# Patient Record
Sex: Female | Born: 1965
Health system: Southern US, Community
[De-identification: ages and names within clinical notes are randomized; demographics above are authoritative.]

## PROBLEM LIST (undated history)

## (undated) DIAGNOSIS — J189 Pneumonia, unspecified organism: Secondary | ICD-10-CM

## (undated) DIAGNOSIS — R760 Raised antibody titer: Secondary | ICD-10-CM

## (undated) DIAGNOSIS — J45909 Unspecified asthma, uncomplicated: Secondary | ICD-10-CM

## (undated) HISTORY — DX: Pneumonia, unspecified organism: J18.9

## (undated) HISTORY — DX: Raised antibody titer: R76.0

## (undated) HISTORY — DX: Unspecified asthma, uncomplicated: J45.909

## (undated) HISTORY — PX: SHOULDER SURGERY: SHX246

---

## 1998-01-26 ENCOUNTER — Ambulatory Visit (HOSPITAL_COMMUNITY): Admission: RE | Admit: 1998-01-26 | Discharge: 1998-01-26 | Payer: Self-pay

## 1999-03-07 ENCOUNTER — Other Ambulatory Visit: Admission: RE | Admit: 1999-03-07 | Discharge: 1999-03-07 | Payer: Self-pay | Admitting: Obstetrics and Gynecology

## 2000-03-07 ENCOUNTER — Other Ambulatory Visit: Admission: RE | Admit: 2000-03-07 | Discharge: 2000-03-07 | Payer: Self-pay | Admitting: *Deleted

## 2001-04-09 ENCOUNTER — Other Ambulatory Visit: Admission: RE | Admit: 2001-04-09 | Discharge: 2001-04-09 | Payer: Self-pay | Admitting: Obstetrics and Gynecology

## 2002-04-13 ENCOUNTER — Other Ambulatory Visit: Admission: RE | Admit: 2002-04-13 | Discharge: 2002-04-13 | Payer: Self-pay | Admitting: Obstetrics and Gynecology

## 2003-04-18 ENCOUNTER — Other Ambulatory Visit: Admission: RE | Admit: 2003-04-18 | Discharge: 2003-04-18 | Payer: Self-pay | Admitting: Obstetrics and Gynecology

## 2008-02-04 ENCOUNTER — Ambulatory Visit: Payer: Self-pay | Admitting: Cardiology

## 2008-02-17 ENCOUNTER — Ambulatory Visit: Payer: Self-pay

## 2008-02-17 ENCOUNTER — Encounter: Payer: Self-pay | Admitting: Cardiology

## 2008-02-23 DIAGNOSIS — Z8679 Personal history of other diseases of the circulatory system: Secondary | ICD-10-CM | POA: Insufficient documentation

## 2008-02-24 ENCOUNTER — Ambulatory Visit: Payer: Self-pay | Admitting: Critical Care Medicine

## 2008-02-24 DIAGNOSIS — J309 Allergic rhinitis, unspecified: Secondary | ICD-10-CM | POA: Insufficient documentation

## 2008-02-24 DIAGNOSIS — R51 Headache: Secondary | ICD-10-CM

## 2008-02-24 DIAGNOSIS — R519 Headache, unspecified: Secondary | ICD-10-CM | POA: Insufficient documentation

## 2008-02-24 DIAGNOSIS — D689 Coagulation defect, unspecified: Secondary | ICD-10-CM

## 2008-02-29 ENCOUNTER — Telehealth (INDEPENDENT_AMBULATORY_CARE_PROVIDER_SITE_OTHER): Payer: Self-pay | Admitting: *Deleted

## 2008-04-18 ENCOUNTER — Ambulatory Visit: Payer: Self-pay | Admitting: Critical Care Medicine

## 2008-04-18 DIAGNOSIS — J45909 Unspecified asthma, uncomplicated: Secondary | ICD-10-CM | POA: Insufficient documentation

## 2008-07-04 ENCOUNTER — Telehealth (INDEPENDENT_AMBULATORY_CARE_PROVIDER_SITE_OTHER): Payer: Self-pay | Admitting: *Deleted

## 2011-01-08 NOTE — Assessment & Plan Note (Signed)
[facility]                            CARDIOLOGY OFFICE NOTE   DELTA, PICHON                          MRN:          [id number]  DATE:02/04/2008                            DOB:          20-Mar-1966    I was asked by Dr. Vickey Sages to  consult on Alexis Yu with  possible palpitations.   Alexis Yu is a delightful 45 year old married white female who has had  intermittent problems with what she thinks is palpitations.  She says  she will be sitting still and feel a heavy sensation in her chest that  will last for a second or two.  This is not associated with exertion.  She has had no presyncope or syncope.  She had no sustained tachy  palpitations.   She was gymnast growing up and never had any problems with exercise  tolerance.  She has never been told she has a heart murmur or any heart  condition.  She had a lot of these at the time she was pregnant.  She  felt like the physician blew her off.   She likes to run and exercise, though lately she has been having  exercise-induced asthma.  She has not seen a pulmonary specialist for  this.  She does not have a primary care physician.   PAST MEDICAL HISTORY:  She is not allergic to any medications.   Her current medications are:  1. Aspirin 81 mg a day.  2. A birth control pill.   She does not smoke.  She smoked up until 1990.   She has 1 to 2 alcoholic beverages a week.  She drinks quite a bit of  caffeinated beverages a day, up to 20 ounces.   Her surgical history:  She had a c-section in 1993.   FAMILY HISTORY:  Is negative for premature coronary disease, though  there is a history of coronary disease in her family.   SOCIAL HISTORY:  She is preschool Ecologist.   She is married and has 2 children.   REVIEW OF SYSTEMS:  She has a history of hypoglycemia.  He has a history  of some chronic fatigue.  Otherwise negative.   She says her lipids are always  good.  She is scheduled for blood work  Dr. Renaldo Fiddler in a few weeks.   Her exam today:  Her blood pressure is 115/77, heart rate is 80.  EKG is  completely normal.  She is 5 feet 9, weighs 182 pounds.  HEENT:  Normocephalic, atraumatic.  PERRL.  Extraocular movements  intact.  Sclerae are clear.  Facial symmetry normal.  Carotid upstrokes  are equal bilaterally without bruits, no JVD.  Thyroid is not enlarged.  Trachea is midline.  NECK:  Is supple.  LUNGS:  Clear to auscultation and percussion.  There is no rhonchi or  wheezes.  HEART:  Reveals a regular rate and rhythm.  There is a split S1 versus  an S4.  I can hear no definite click.  There is a soft little systolic  murmur at the  apex.  S2 splits physiologically. ABDOMINAL EXAM:  Soft,  good bowel sounds.  No midline bruit.  No hepatomegaly.  EXTREMITIES:  No cyanosis, clubbing or edema.  Pulses are intact.  NEUROLOGIC EXAM:  Is intact.   ASSESSMENT:  1. History of palpitations which are cyclical.  I suspect these are      benign.  She may have underlying prolapse.  2. Exercise-induced asthma.  As I discussed with her today, this is a      much more serious issue.   RECOMMENDATIONS:  1. A 2-D echocardiogram to evaluate for structural heart disease and      rule out prolapse.  2. Reassurance.  3. Pulmonary referral to Dr. Danise Mina for exercise-induced asthma.     Thomas C. Daleen Squibb, MD, [facility]  Electronically Signed    TCW/MedQ  DD: 02/04/2008  DT: 02/04/2008  Job #: [id number]   cc:   Charlcie Cradle. Delford Field, MD, FCCP

## 2013-01-06 ENCOUNTER — Other Ambulatory Visit: Payer: Self-pay | Admitting: Obstetrics & Gynecology

## 2013-01-06 ENCOUNTER — Ambulatory Visit (INDEPENDENT_AMBULATORY_CARE_PROVIDER_SITE_OTHER): Payer: BC Managed Care – PPO | Admitting: Obstetrics & Gynecology

## 2013-01-06 ENCOUNTER — Encounter: Payer: Self-pay | Admitting: Obstetrics & Gynecology

## 2013-01-06 VITALS — BP 117/74 | HR 67 | Resp 16 | Ht 70.0 in | Wt 192.0 lb

## 2013-01-06 DIAGNOSIS — K5900 Constipation, unspecified: Secondary | ICD-10-CM

## 2013-01-06 DIAGNOSIS — N920 Excessive and frequent menstruation with regular cycle: Secondary | ICD-10-CM

## 2013-01-06 DIAGNOSIS — R102 Pelvic and perineal pain: Secondary | ICD-10-CM

## 2013-01-06 DIAGNOSIS — Z01419 Encounter for gynecological examination (general) (routine) without abnormal findings: Secondary | ICD-10-CM

## 2013-01-06 DIAGNOSIS — K5909 Other constipation: Secondary | ICD-10-CM | POA: Insufficient documentation

## 2013-01-06 DIAGNOSIS — Z124 Encounter for screening for malignant neoplasm of cervix: Secondary | ICD-10-CM

## 2013-01-06 DIAGNOSIS — N949 Unspecified condition associated with female genital organs and menstrual cycle: Secondary | ICD-10-CM

## 2013-01-06 DIAGNOSIS — Z1151 Encounter for screening for human papillomavirus (HPV): Secondary | ICD-10-CM

## 2013-01-06 LAB — LIPID PANEL: LDL Cholesterol: 132 mg/dL — ABNORMAL HIGH (ref 0–99)

## 2013-01-06 LAB — TSH: TSH: 2.465 u[IU]/mL (ref 0.350–4.500)

## 2013-01-06 LAB — CBC
HCT: 38.4 % (ref 36.0–46.0)
Hemoglobin: 12.9 g/dL (ref 12.0–15.0)
MCHC: 33.6 g/dL (ref 30.0–36.0)
MCV: 88.1 fL (ref 78.0–100.0)

## 2013-01-06 NOTE — Progress Notes (Signed)
  Subjective:     Alexis Yu is a 47 y.o. female here for a routine exam.  Current complaints: history of thickened endometrium with heavy menses.  Was on OCPs but self d/c'd due to strong family history of stroke and anticardiolipin positive.  Pt has intermittent pelvic sharp pains that occur throughout cycle.  Pt has BM q3-4 days.  Personal health questionnaire reviewed: yes.   Gynecologic History Patient's last menstrual period was 12/30/2012. Contraception: vasectomy Last Pap: ?2010. Results were: normal per patient Last mammogram: a/. Results were: normal per pt  Obstetric History OB History   Grav Para Term Preterm Abortions TAB SAB Ect Mult Living   3 2 1 1 1  1   2      # Outc Date GA Lbr Len/2nd Wgt Sex Del Anes PTL Lv   1 TRM      SVD      Comments: induction   2 PRE  [redacted]w[redacted]d    CS      3 SAB                The following portions of the patient's history were reviewed and updated as appropriate: allergies, current medications, past family history, past medical history, past social history, past surgical history and problem list.  Review of Systems Pertinent items are noted in HPI.    Objective:   Filed Vitals:   01/06/13 0841  BP: 117/74  Pulse: 67  Resp: 16  Height: 5\' 10"  (1.778 m)  Weight: 192 lb (87.091 kg)      Vitals:  WNL General appearance: alert, cooperative and no distress Head: Normocephalic, without obvious abnormality, atraumatic Eyes: negative Throat: lips, mucosa, and tongue normal; teeth and gums normal Lungs: clear to auscultation bilaterally Breasts: normal appearance, no masses or tenderness, No nipple retraction or dimpling, No nipple discharge or bleeding Heart: regular rate and rhythm Abdomen: soft, non-tender; bowel sounds normal; no masses,  no organomegaly Pelvic: cervix normal in appearance, external genitalia normal, no adnexal masses or tenderness, no bladder tenderness, no cervical motion tenderness, perianal skin: no external  genital warts noted, rectovaginal septum normal, urethra without abnormality or discharge, uterus normal size (retroverted), shape, and consistency and vagina normal without discharge Extremities: no edema, redness or tenderness in the calves or thighs Skin: no lesions or rash Lymph nodes: Axillary adenopathy: none        Assessment:    Healthy female exam.  Pelvic pain mild history of thickened endometrium   Plan:    Education reviewed: self breast exams and skin cancer screening. Contraception: vasectomy. Mammogram ordered. Follow up in: 1 year. pap with HPV Miralax for contipation.  If having regular BM  and still having pain, pt to return CBC, TSH and TVUS only for heavy menses. Referral to Dr. Darra Lis for primary care

## 2013-01-07 ENCOUNTER — Telehealth: Payer: Self-pay | Admitting: *Deleted

## 2013-01-07 NOTE — Telephone Encounter (Signed)
Copy of labs mailed to pt's home address.  She will f/u on her cholesterol values with her PCP.

## 2013-01-12 ENCOUNTER — Ambulatory Visit (INDEPENDENT_AMBULATORY_CARE_PROVIDER_SITE_OTHER): Payer: BC Managed Care – PPO

## 2013-01-12 ENCOUNTER — Ambulatory Visit: Payer: BC Managed Care – PPO

## 2013-01-12 ENCOUNTER — Other Ambulatory Visit: Payer: Self-pay | Admitting: Obstetrics & Gynecology

## 2013-01-12 ENCOUNTER — Ambulatory Visit (HOSPITAL_BASED_OUTPATIENT_CLINIC_OR_DEPARTMENT_OTHER)
Admission: RE | Admit: 2013-01-12 | Discharge: 2013-01-12 | Disposition: A | Discharge status: Home / Self Care | Payer: BC Managed Care – PPO | Source: Ambulatory Visit | Attending: Obstetrics & Gynecology | Admitting: Obstetrics & Gynecology

## 2013-01-12 DIAGNOSIS — R102 Pelvic and perineal pain: Secondary | ICD-10-CM

## 2013-01-12 DIAGNOSIS — N949 Unspecified condition associated with female genital organs and menstrual cycle: Secondary | ICD-10-CM

## 2013-01-12 DIAGNOSIS — G8929 Other chronic pain: Secondary | ICD-10-CM

## 2013-01-12 DIAGNOSIS — Z01419 Encounter for gynecological examination (general) (routine) without abnormal findings: Secondary | ICD-10-CM

## 2013-01-12 DIAGNOSIS — N920 Excessive and frequent menstruation with regular cycle: Secondary | ICD-10-CM

## 2013-01-12 DIAGNOSIS — Z1231 Encounter for screening mammogram for malignant neoplasm of breast: Secondary | ICD-10-CM

## 2013-01-12 DIAGNOSIS — K5909 Other constipation: Secondary | ICD-10-CM

## 2013-01-19 ENCOUNTER — Other Ambulatory Visit: Payer: BC Managed Care – PPO

## 2013-01-19 ENCOUNTER — Ambulatory Visit: Payer: BC Managed Care – PPO

## 2013-01-25 ENCOUNTER — Encounter: Payer: Self-pay | Admitting: Obstetrics & Gynecology

## 2013-01-26 ENCOUNTER — Telehealth: Payer: Self-pay | Admitting: *Deleted

## 2013-01-26 NOTE — Telephone Encounter (Signed)
Message copied by Granville Lewis on Tue Jan 26, 2013  8:52 AM ------      Message from: Lesly Dukes      Created: Mon Jan 25, 2013  7:02 PM       Pt has nml [place].  Please call pt with results ------

## 2013-01-26 NOTE — Telephone Encounter (Signed)
Pt notified of normal u/s .  She is to F/u with Dr Linford Arnold for her cholesterol

## 2014-06-27 ENCOUNTER — Encounter: Payer: Self-pay | Admitting: Obstetrics & Gynecology

## 2015-09-27 ENCOUNTER — Ambulatory Visit (INDEPENDENT_AMBULATORY_CARE_PROVIDER_SITE_OTHER): Payer: 59 | Admitting: Osteopathic Medicine

## 2015-09-27 ENCOUNTER — Encounter: Payer: Self-pay | Admitting: Osteopathic Medicine

## 2015-09-27 VITALS — BP 120/62 | HR 69 | Ht 69.0 in | Wt 223.0 lb

## 2015-09-27 DIAGNOSIS — Z0000 Encounter for general adult medical examination without abnormal findings: Secondary | ICD-10-CM

## 2015-09-27 DIAGNOSIS — R61 Generalized hyperhidrosis: Secondary | ICD-10-CM

## 2015-09-27 DIAGNOSIS — Z23 Encounter for immunization: Secondary | ICD-10-CM | POA: Diagnosis not present

## 2015-09-27 DIAGNOSIS — Z13220 Encounter for screening for lipoid disorders: Secondary | ICD-10-CM

## 2015-09-27 DIAGNOSIS — Z2821 Immunization not carried out because of patient refusal: Secondary | ICD-10-CM

## 2015-09-27 DIAGNOSIS — M25531 Pain in right wrist: Secondary | ICD-10-CM | POA: Diagnosis not present

## 2015-09-27 DIAGNOSIS — N951 Menopausal and female climacteric states: Secondary | ICD-10-CM | POA: Diagnosis not present

## 2015-09-27 DIAGNOSIS — Z1239 Encounter for other screening for malignant neoplasm of breast: Secondary | ICD-10-CM

## 2015-09-27 DIAGNOSIS — Z79899 Other long term (current) drug therapy: Secondary | ICD-10-CM | POA: Diagnosis not present

## 2015-09-27 DIAGNOSIS — Z1211 Encounter for screening for malignant neoplasm of colon: Secondary | ICD-10-CM

## 2015-09-27 MED ORDER — ZOSTER VACCINE LIVE 19400 UNT/0.65ML ~~LOC~~ SOLR: 0.6500 mL | Freq: Once | SUBCUTANEOUS | Status: DC
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
## 2015-09-27 NOTE — Patient Instructions (Signed)
You should get a call about scheduling your mammogram, please let us know if you don't. You should receive your cologuard tests in the mail, please let us know if you don't get this in the next few weeks.  Please get lab work done fasting to ensure accurate fasting blood sugar for diabetes screening and accurate cholesterol reading. Using given a prescription for shingles vaccine to take to your pharmacy to check for insurance coverage.   Regarding nonhormonal treatment for menopausal symptoms, can try increasing soy or phyto-estrogens intake, black cohosh over-the-counter supplementation, vitamin D over-the-counter supplementation. If your symptoms are still bothering you, please return to the office and we can discuss other medication management options such as certain antidepressants or other nonhormonal treatments.   Regarding wrist pain, see below for instructions on home care for tendinitis. If this issue becomes worse or isn't resolving, we can schedule a follow-up visit with me, or you can see Dr. Denyse Amass or Dr. Benjamin Stain, our sports medicine physicians in the practice   Thanks for coming in today, please let us know if there is anything else we can do for you, please call us if there are any questions or concerns! Thanks! -Dr. Mervyn Skeeters.       Tendinitis Tendinitis is swelling and inflammation of the tendons. Tendons are band-like tissues that connect muscle to bone. Tendinitis commonly occurs in the:   Shoulders (rotator cuff).  Heels (Achilles tendon).  Elbows (triceps tendon). CAUSES Tendinitis is usually caused by overusing the tendon, muscles, and joints involved. When the tissue surrounding a tendon (synovium) becomes inflamed, it is called tenosynovitis. Tendinitis commonly develops in people whose jobs require repetitive motions. SYMPTOMS  Pain.  Tenderness.  Mild swelling. DIAGNOSIS Tendinitis is usually diagnosed by physical exam. Your health care provider may also  order X-rays or other imaging tests. TREATMENT Your health care provider may recommend certain medicines or exercises for your treatment. HOME CARE INSTRUCTIONS   Use a sling or splint for as long as directed by your health care provider until the pain decreases.  Put ice on the injured area.  Put ice in a plastic bag.  Place a towel between your skin and the bag.  Leave the ice on for 15-20 minutes, 3-4 times a day, or as directed by your health care provider.  Avoid using the limb while the tendon is painful. Perform gentle range of motion exercises only as directed by your health care provider. Stop exercises if pain or discomfort increase, unless directed otherwise by your health care provider.  Only take over-the-counter or prescription medicines for pain, discomfort, or fever as directed by your health care provider. SEEK MEDICAL CARE IF:   Your pain and swelling increase.  You develop new, unexplained symptoms, especially increased numbness in the hands. MAKE SURE YOU:   Understand these instructions.  Will watch your condition.  Will get help right away if you are not doing well or get worse.   This information is not intended to replace advice given to you by your health care provider. Make sure you discuss any questions you have with your health care provider.   Document Released: 08/09/2000 Document Revised: 09/02/2014 Document Reviewed: 10/29/2010 Elsevier Interactive Patient Education [employer]

## 2015-10-13 LAB — CBC WITH DIFFERENTIAL/PLATELET
BASOS ABS: 0 10*3/uL (ref 0.0–0.1)
Basophils Relative: 1 % (ref 0–1)
Eosinophils Absolute: 0.2 10*3/uL (ref 0.0–0.7)
Eosinophils Relative: 5 % (ref 0–5)
HEMATOCRIT: 40.9 % (ref 36.0–46.0)
HEMOGLOBIN: 13.4 g/dL (ref 12.0–15.0)
LYMPHS ABS: 1.5 10*3/uL (ref 0.7–4.0)
LYMPHS PCT: 37 % (ref 12–46)
MCH: 29.1 pg (ref 26.0–34.0)
MCHC: 32.8 g/dL (ref 30.0–36.0)
MCV: 88.7 fL (ref 78.0–100.0)
MPV: 9.6 fL (ref 8.6–12.4)
Monocytes Absolute: 0.4 10*3/uL (ref 0.1–1.0)
Monocytes Relative: 9 % (ref 3–12)
NEUTROS ABS: 2 10*3/uL (ref 1.7–7.7)
NEUTROS PCT: 48 % (ref 43–77)
Platelets: 337 10*3/uL (ref 150–400)
RBC: 4.61 MIL/uL (ref 3.87–5.11)
RDW: 13.6 % (ref 11.5–15.5)
WBC: 4.1 10*3/uL (ref 4.0–10.5)

## 2015-10-14 LAB — COMPLETE METABOLIC PANEL WITH GFR
ALBUMIN: 4.2 g/dL (ref 3.6–5.1)
ALK PHOS: 59 U/L (ref 33–130)
ALT: 12 U/L (ref 6–29)
AST: 15 U/L (ref 10–35)
BILIRUBIN TOTAL: 0.6 mg/dL (ref 0.2–1.2)
BUN: 14 mg/dL (ref 7–25)
CALCIUM: 9.3 mg/dL (ref 8.6–10.4)
CO2: 28 mmol/L (ref 20–31)
Chloride: 106 mmol/L (ref 98–110)
Creat: 0.95 mg/dL (ref 0.50–1.05)
GFR, EST AFRICAN AMERICAN: 81 mL/min (ref >=60)
GFR, EST NON AFRICAN AMERICAN: 70 mL/min (ref >=60)
GLUCOSE: 99 mg/dL (ref 65–99)
Potassium: 4.3 mmol/L (ref 3.5–5.3)
Sodium: 142 mmol/L (ref 135–146)
TOTAL PROTEIN: 6.9 g/dL (ref 6.1–8.1)

## 2015-10-14 LAB — LIPID PANEL
CHOLESTEROL: 187 mg/dL (ref 125–200)
HDL: 48 mg/dL (ref >=46)
LDL Cholesterol: 125 mg/dL (ref <130)
TRIGLYCERIDES: 68 mg/dL (ref <150)
Total CHOL/HDL Ratio: 3.9 Ratio (ref <=5.0)
VLDL: 14 mg/dL (ref <30)

## 2015-10-14 LAB — TSH: TSH: 2.92 m[IU]/L

## 2015-10-25 LAB — COLOGUARD: Cologuard: NEGATIVE

## 2015-11-10 ENCOUNTER — Telehealth: Payer: Self-pay | Admitting: Osteopathic Medicine

## 2015-11-10 NOTE — Telephone Encounter (Signed)
Please call patient: I have received her results from cologuard, testing was negative, plan to repeat in 3 years  

## 2015-11-10 NOTE — Telephone Encounter (Signed)
Left detailed message on patient vm with results as noted below. Treyvin Glidden,CMA  

## 2015-11-20 ENCOUNTER — Encounter: Payer: Self-pay | Admitting: Osteopathic Medicine

## 2015-12-12 ENCOUNTER — Encounter: Payer: Self-pay | Admitting: Osteopathic Medicine

## 2015-12-12 ENCOUNTER — Ambulatory Visit (INDEPENDENT_AMBULATORY_CARE_PROVIDER_SITE_OTHER): Payer: 59 | Admitting: Osteopathic Medicine

## 2015-12-12 VITALS — BP 120/83 | HR 76 | Ht 70.0 in | Wt 213.0 lb

## 2015-12-12 DIAGNOSIS — R21 Rash and other nonspecific skin eruption: Secondary | ICD-10-CM | POA: Diagnosis not present

## 2015-12-12 NOTE — Progress Notes (Signed)
HPI: Alexis Yu is a 50 y.o. female who [facility] for chief complaint of:  Chief Complaint  Patient presents with  . Rash    ON RIGHT SIDE OF CHEST THAT APPEARED A COUPLE OF DAYS AGO    SKIN . Location: upper R chest . Quality: dry, red, swollen . Duration: few days, "it just popped up"  Context: had another similar one on back which seemed to 'dry up" and now a dry patch remains there . Assoc signs/symptoms: no itching, no joint pain/fever, rash is nonpainful except occasionally irritated when clothing rubs against it   Past medical, social and family history reviewed: Past Medical History  Diagnosis Date  . Asthma   . Pneumonia   . Antiphospholipid antibody positive   . Uterine hyperplasia    Past Surgical History  Procedure Laterality Date  . Cesarean section  1993   Social History  Substance Use Topics  . Smoking status: Never Smoker   . Smokeless tobacco: Never Used  . Alcohol Use: Yes     Comment: occassional   Family History  Problem Relation Age of Onset  . Diabetes Father   . Stroke Father   . Heart disease Paternal Grandfather   . Heart disease Maternal Grandmother   . Stroke Maternal Grandmother   . Stroke Mother   . Depression Sister   . Cancer Paternal Uncle     ESOPHOGIAL    Current Outpatient Prescriptions  Medication Sig Dispense Refill  . Green Tea, Camillia sinensis, (GREEN TEA PO) Take 400 mg by mouth 2 (two) times daily.    Marland Kitchen. zoster vaccine live, PF, (ZOSTAVAX) [id number] UNT/0.65ML injection Inject 19,400 Units into the skin once. 1 each 0   No current facility-administered medications for this visit.   No Known Allergies    Review of Systems: CONSTITUTIONAL:  No  fever, no chills, No  unintentional weight changes HEAD/EYES/EARS/NOSE/THROAT: No  headache, no vision change, CARDIAC: No  chest pain, MUSCULOSKELETAL: No  myalgia/arthralgia SKIN: (+) rash/wounds/concerning  lesions HEM/ONC: No  easy bruising/bleeding, No  abnormal lymph node   Exam:  BP 120/83 mmHg  Pulse 76  Ht 5\' 10"  (1.778 m)  Wt 213 lb (96.616 kg)  BMI 30.56 kg/m2 Constitutional: VS see above. General Appearance: alert, well-developed, well-nourished, NAD Respiratory: Normal respiratory effort.  Musculoskeletal: Gait normal. No clubbing/cyanosis of digits.  Neurological: No cranial nerve deficit on limited exam. Motor and sensation intact and symmetric Skin: warm, dry, intact. (+)rash: approx 2cm diameter, irregular, (+)erythema with mild edema but no vesicles/ulceraion, no scaling, no purpura. No concerning nevi or subq nodules on limited exam.  Normal age-related skin changes/discoloration but no other concerning lesions/nevi. Lesion on back appears as light dry flat papule appears like extremely light-colored seborrheic dermatosis.  Psychiatric: Normal judgment/insight. Normal mood and affect. Oriented x3.    No results found for this or any previous visit (from the past 72 hour(s)).   Procedure: Punch biopsy skin. Patient was advised of risks of procedure, including pain, bleeding, infection, damage to surrounding tissues including nerve damage, inadequate diagnosis, scarring, possible need for further procedures - versus benefits, including accurate diagnosis to guide treatment. Patient opts to proceed with the biopsy. Skin was prepped with alcohol, anesthesia 1% lidocaine with epi, approx 1 mL, biopsy was taken on R chest, hemostasis obtained, 1 simple interrupted suture was placed, and wound is bandaged. Patient given home care instructions. Patient tolerated procedure well and all questions answered.  ASSESSMENT/PLAN:  Rash - appearance c/w mild fungal infection vs other inflammatory problem but has no itching or pain. Biopsy today, await results though appears benign at this time - Plan: Dermatology pathology   Return in about 8 days (around 12/20/2015), or sooner if needed,  for suture removal (12/22/15 at the latest).

## 2015-12-12 NOTE — Patient Instructions (Signed)
Laceration Care, Adult  A laceration is a cut that goes through all of the layers of the skin and into the tissue that is right under the skin. Some lacerations heal on their own. Others need to be closed with stitches (sutures), staples, skin adhesive strips, or skin glue. Proper laceration care minimizes the risk of infection and helps the laceration to heal better.  HOW TO CARE FOR YOUR LACERATION  If sutures or staples were used:  · Keep the wound clean and dry.  · If you were given a bandage (dressing), you should change it at least one time per day or as told by your health care provider. You should also change it if it becomes wet or dirty.  · Keep the wound completely dry for the first 24 hours or as told by your health care provider. After that time, you may shower or bathe. However, make sure that the wound is not soaked in water until after the sutures or staples have been removed.  · Clean the wound one time each day or as told by your health care provider:    Wash the wound with soap and water.    Rinse the wound with water to remove all soap.    Pat the wound dry with a clean towel. Do not rub the wound.  · After cleaning the wound, apply a thin layer of antibiotic ointment as told by your health care provider. This will help to prevent infection and keep the dressing from sticking to the wound.  · Have the sutures or staples removed as told by your health care provider.    General Instructions  · Take over-the-counter and prescription medicines only as told by your health care provider.  · If you were prescribed an antibiotic medicine or ointment, take or apply it as told by your doctor. Do not stop using it even if your condition improves.  · To help prevent scarring, make sure to cover your wound with sunscreen whenever you are outside after stitches are removed, after adhesive strips are removed, or when glue remains in place and the wound is healed. Make sure to wear a sunscreen of at least 30  SPF.  · Do not scratch or pick at the wound.  · Keep all follow-up visits as told by your health care provider. This is important.  · Check your wound every day for signs of infection. Watch for:    Redness, swelling, or pain.    Fluid, blood, or pus.  · Raise (elevate) the injured area above the level of your heart while you are sitting or lying down, if possible.  SEEK MEDICAL CARE IF:  · You received a tetanus shot and you have swelling, severe pain, redness, or bleeding at the injection site.  · You have a fever.  · A wound that was closed breaks open.  · You notice a bad smell coming from your wound or your dressing.  · You notice something coming out of the wound, such as wood or glass.  · Your pain is not controlled with medicine.  · You have increased redness, swelling, or pain at the site of your wound.  · You have fluid, blood, or pus coming from your wound.  · You notice a change in the color of your skin near your wound.  · You need to change the dressing frequently due to fluid, blood, or pus draining from the wound.  · You develop a new rash.  ·   You develop numbness around the wound.  SEEK IMMEDIATE MEDICAL CARE IF:  · You develop severe swelling around the wound.  · Your pain suddenly increases and is severe.  · You develop painful lumps near the wound or on skin that is anywhere on your body.  · You have a red streak going away from your wound.  · The wound is on your hand or foot and you cannot properly move a finger or toe.  · The wound is on your hand or foot and you notice that your fingers or toes look pale or bluish.     This information is not intended to replace advice given to you by your health care provider. Make sure you discuss any questions you have with your health care provider.     Document Released: 08/12/2005 Document Revised: 12/27/2014 Document Reviewed: 08/08/2014  Elsevier Interactive Patient Education ©2016 [employer] Inc[employer]

## 2015-12-15 ENCOUNTER — Encounter: Payer: Self-pay | Admitting: Osteopathic Medicine

## 2015-12-20 ENCOUNTER — Encounter: Payer: Self-pay | Admitting: Osteopathic Medicine

## 2015-12-20 ENCOUNTER — Ambulatory Visit (INDEPENDENT_AMBULATORY_CARE_PROVIDER_SITE_OTHER): Payer: 59 | Admitting: Osteopathic Medicine

## 2015-12-20 VITALS — BP 103/70 | HR 96 | Ht 70.0 in | Wt 215.0 lb

## 2015-12-20 DIAGNOSIS — L280 Lichen simplex chronicus: Secondary | ICD-10-CM | POA: Diagnosis not present

## 2015-12-20 DIAGNOSIS — Z4802 Encounter for removal of sutures: Secondary | ICD-10-CM

## 2015-12-20 NOTE — Progress Notes (Signed)
HPI: Alexis Yu is a 50 y.o. female who [facility] [place] today for chief complaint of:  Chief Complaint  Patient presents with  . Suture / Staple Removal     . Location: R chest  . Quality: healing fine, skin looking a bit better . Context: recent punch biopsy . Assoc signs/symptoms: no redness or drainage, no fever   Past medical, social and family history reviewed: Past Medical History  Diagnosis Date  . Asthma   . Pneumonia   . Antiphospholipid antibody positive   . Uterine hyperplasia    Past Surgical History  Procedure Laterality Date  . Cesarean section  1993   Social History  Substance Use Topics  . Smoking status: Never Smoker   . Smokeless tobacco: Never Used  . Alcohol Use: Yes     Comment: occassional   Family History  Problem Relation Age of Onset  . Diabetes Father   . Stroke Father   . Heart disease Paternal Grandfather   . Heart disease Maternal Grandmother   . Stroke Maternal Grandmother   . Stroke Mother   . Depression Sister   . Cancer Paternal Uncle     ESOPHOGIAL    Current Outpatient Prescriptions  Medication Sig Dispense Refill  . Green Tea, Camillia sinensis, (GREEN TEA PO) Take 400 mg by mouth 2 (two) times daily.    Marland Kitchen. zoster vaccine live, PF, (ZOSTAVAX) [id number] UNT/0.65ML injection Inject 19,400 Units into the skin once. 1 each 0   No current facility-administered medications for this visit.   No Known Allergies    Review of Systems: CONSTITUTIONAL:  No  fever, no chills SKIN: No  Additional rash/wounds/concerning lesions HEM/ONC: No  easy bruising/bleeding, No  abnormal lymph node   Exam:  BP 103/70 mmHg  Pulse 96  Ht 5\' 10"  (1.778 m)  Wt 215 lb (97.523 kg)  BMI 30.85 kg/m2 Constitutional: VS see above. General Appearance: alert, well-developed, well-nourished, NAD Skin: warm, dry, intact. Sutures removed without difficulty. No rash/ulcer.  Psychiatric: Normal judgment/insight.  Normal mood and affect.  PROCEDURE: Skin cleaned with alcohol swab, single suture removed with scissors and forceps without difficulty.     ASSESSMENT/PLAN:   Lichenoid dermatitis - patient educated on biopsy results - benign diagnosis  Visit for suture removal   Return as needed and 09/2016 for annual physical.

## 2016-06-05 ENCOUNTER — Ambulatory Visit (INDEPENDENT_AMBULATORY_CARE_PROVIDER_SITE_OTHER): Payer: 59

## 2016-06-05 ENCOUNTER — Ambulatory Visit (INDEPENDENT_AMBULATORY_CARE_PROVIDER_SITE_OTHER): Payer: 59 | Admitting: Osteopathic Medicine

## 2016-06-05 ENCOUNTER — Encounter: Payer: Self-pay | Admitting: Osteopathic Medicine

## 2016-06-05 VITALS — BP 116/73 | HR 97 | Temp 97.9°F | Ht 69.0 in | Wt 223.0 lb

## 2016-06-05 DIAGNOSIS — R05 Cough: Secondary | ICD-10-CM

## 2016-06-05 DIAGNOSIS — R0602 Shortness of breath: Secondary | ICD-10-CM

## 2016-06-05 DIAGNOSIS — J4521 Mild intermittent asthma with (acute) exacerbation: Secondary | ICD-10-CM | POA: Insufficient documentation

## 2016-06-05 DIAGNOSIS — R059 Cough, unspecified: Secondary | ICD-10-CM

## 2016-06-05 DIAGNOSIS — Z8709 Personal history of other diseases of the respiratory system: Secondary | ICD-10-CM | POA: Diagnosis not present

## 2016-06-05 MED ORDER — E-Z SPACER DEVI: 2 refills | Status: AC

## 2016-06-05 MED ORDER — ALBUTEROL SULFATE HFA 108 (90 BASE) MCG/ACT IN AERS: 2.0000 | INHALATION_SPRAY | Freq: Four times a day (QID) | RESPIRATORY_TRACT | 6 refills | Status: DC | PRN

## 2016-06-05 MED ORDER — IPRATROPIUM-ALBUTEROL 0.5-2.5 (3) MG/3ML IN SOLN
3.0000 mL | Freq: Once | RESPIRATORY_TRACT | Status: AC
  Administered 2016-06-05: 3 mL via RESPIRATORY_TRACT

## 2016-06-05 MED ORDER — AZITHROMYCIN 250 MG PO TABS
ORAL_TABLET | ORAL | 0 refills | Status: DC
Start: 2016-06-05 — End: 2016-10-02

## 2016-06-05 MED ORDER — IPRATROPIUM-ALBUTEROL 0.5-2.5 (3) MG/3ML IN SOLN
3.0000 mL | Freq: Four times a day (QID) | RESPIRATORY_TRACT | Status: DC
  Administered 2016-06-05: 3 mL via RESPIRATORY_TRACT

## 2016-06-05 MED ORDER — PREDNISONE 20 MG PO TABS: 20.0000 mg | ORAL_TABLET | Freq: Two times a day (BID) | ORAL | 0 refills | Status: DC

## 2016-06-05 NOTE — Progress Notes (Signed)
HPI: Alexis Yu is a 50 y.o. female who [facility] [place]  today for chief complaint of:  Chief Complaint  Patient presents with  . Cough    Acute Illness: . Context: URI few weeks ago, cough has persisted, feeling SOB . Location/Quality: chest congestion and tightness feeling, persistent cough . Assoc signs/symptoms: see ROS . Duration: >2 weeks . Modifying factors: has tried the following OTC/Rx medications: OTC cold treatments initially but cough and SOB have got her concerned   Past medical, social and family history reviewed. PMH includes previous Dx asthma - previously on ICS and SABA  Current medications and allergies reviewed.     Review of Systems:  Constitutional: no fever/chills  HEENT: no headache, no vision change or hearing change, no sore throat  Cardiovascular: No chest pain, No pressure/palpitations  Respiratory: yes cough, yes shortness of breath  Gastrointestinal: no nausea, no vomiting, no abdominal pain, no diarrhea  Musculoskeletal: no myalgia/arthralgia  Skin/Integument: no rash   Exam:  BP 116/73   Pulse 97   Temp 97.9 F (36.6 C) (Oral)   Ht 5\' 9"  (1.753 m)   Wt 223 lb (101.2 kg)   BMI 32.93 kg/m   Constitutional: VSS, see above. General Appearance: alert, well-developed, well-nourished, NAD  Eyes: Normal lids and conjunctive, non-icteric sclera, PERRLA  Ears, Nose, Mouth, Throat: Normal external inspection ears/nares/mouth/lips/gums, normal TM, MMM; posterior pharynx without erythema, without exudate, nasal mucosa normal  Neck: No masses, trachea midline. normal lymph nodes  Respiratory: Normal respiratory effort. Yes  wheeze a bit worse on L, no rhonchi/rales. Resolved after breathing treatment.   Cardiovascular: S1/S2 normal, no murmur/rub/gallop auscultated. RRR.    Dg Chest 2 View  Result Date: 06/05/2016 CLINICAL DATA:  Cough and shortness of breath for 1 month EXAM: CHEST  2 VIEW  COMPARISON:  02/24/2008 FINDINGS: The heart size and mediastinal contours are within normal limits. Both lungs are clear. The visualized skeletal structures are unremarkable. IMPRESSION: No active cardiopulmonary disease. Electronically Signed   By: Alcide CleverMark  Lukens M.D.   On: 06/05/2016 14:40     ASSESSMENT/PLAN:  Mild intermittent asthma with acute exacerbation - Plan: albuterol (PROVENTIL HFA;VENTOLIN HFA) 108 (90 Base) MCG/ACT inhaler, predniSONE (DELTASONE) 20 MG tablet, azithromycin (ZITHROMAX) 250 MG tablet, ipratropium-albuterol (DUONEB) 0.5-2.5 (3) MG/3ML nebulizer solution 3 mL, DISCONTINUED: ipratropium-albuterol (DUONEB) 0.5-2.5 (3) MG/3ML nebulizer solution 3 mL  Cough - Plan: DG Chest 2 View  Shortness of breath - Plan: DG Chest 2 View  History of asthma - Plan: albuterol (PROVENTIL HFA;VENTOLIN HFA) 108 (90 Base) MCG/ACT inhaler  Patient Instructions  Inhaler use every 2-4 hours as needed for shortness of breath. Steroids for 5 days. If no improvement, I gave prescription for antibiotics to take just in case this gets worse, but I don't see any pneumonia on the chest x-ray today and neither did the radiologist's I doubt very much that we'll need this antibiotic. If you find that he needs to fill it, do call the office and let me know. If you're feeling better in the space of the week, probably no further workup will be needed. If you are not feeling better or if you're having some residual cough/illness of breath we will need to start an inhaled steroid medication. Please let us know if you have any other questions or concerns, otherwise plan to follow-up for routine annual physical!    Visit summary was printed for the patient with medications and pertinent instructions for patient to  review. ER/RTC precautions reviewed. All questions answered. Return if symptoms worsen or fail to improve, for e.

## 2016-06-05 NOTE — Patient Instructions (Signed)
Inhaler use every 2-4 hours as needed for shortness of breath. Steroids for 5 days. If no improvement, I gave prescription for antibiotics to take just in case this gets worse, but I don't see any pneumonia on the chest x-ray today and neither did the radiologist's I doubt very much that we'll need this antibiotic. If you find that he needs to fill it, do call the office and let me know. If you're feeling better in the space of the week, probably no further workup will be needed. If you are not feeling better or if you're having some residual cough/illness of breath we will need to start an inhaled steroid medication. Please let us know if you have any other questions or concerns, otherwise plan to follow-up for routine annual physical!

## 2016-10-02 ENCOUNTER — Encounter: Payer: Self-pay | Admitting: Family Medicine

## 2016-10-02 ENCOUNTER — Ambulatory Visit (INDEPENDENT_AMBULATORY_CARE_PROVIDER_SITE_OTHER): Payer: 59 | Admitting: Family Medicine

## 2016-10-02 DIAGNOSIS — M25511 Pain in right shoulder: Secondary | ICD-10-CM | POA: Diagnosis not present

## 2016-10-02 MED ORDER — OMEPRAZOLE 40 MG PO CPDR
40.0000 mg | DELAYED_RELEASE_CAPSULE | Freq: Every day | ORAL | 3 refills | Status: DC
Start: 2016-10-02 — End: 2018-07-27

## 2016-10-02 MED ORDER — DICLOFENAC SODIUM 1 % TD GEL: 4.0000 g | Freq: Four times a day (QID) | TRANSDERMAL | 11 refills | Status: DC

## 2016-10-02 NOTE — Patient Instructions (Signed)
Thank you for coming in today. Call or go to the ER if you develop a large red swollen joint with extreme pain or oozing puss.  Do PT Use voltaren gel and home oral ibuprofen or naproxen. If you are taking ibuprofen or naproxen for more than a few days please take prilosec of zantac or papcid with it to protect your stomach.    Shoulder Impingement Syndrome Shoulder impingement syndrome is a condition that causes pain when connective tissues (tendons) surrounding the shoulder joint become pinched. These tendons are part of the group of muscles and tissues that help to stabilize the shoulder (rotator cuff). Beneath the rotator cuff is a fluid-filled sac (bursa) that allows the muscles and tendons to glide smoothly. The bursa may become swollen or irritated (bursitis). Bursitis, swelling in the rotator cuff tendons, or both conditions can decrease how much space is under a bone in the shoulder joint (acromion), resulting in impingement. What are the causes? Shoulder impingement syndrome can be caused by bursitis or swelling of the rotator cuff tendons, which may result from:  Repetitive overhead arm movements.  Falling onto the shoulder.  Weakness in the shoulder muscles. What increases the risk? You may be more likely to develop this condition if you are an athlete who participates in:  Sports that involve throwing, such as baseball.  Tennis.  Swimming.  Volleyball. Some people are also more likely to develop impingement syndrome because of the shape of their acromion bone. What are the signs or symptoms? The main symptom of this condition is pain on the front or side of the shoulder. Pain may:  Get worse when lifting or raising the arm.  Get worse at night.  Wake you up from sleeping.  Feel sharp when the shoulder is moved, and then fade to an ache. Other signs and symptoms may include:  Tenderness.  Stiffness.  Inability to raise the arm above shoulder level or behind the  body.  Weakness. How is this diagnosed? This condition may be diagnosed based on:  Your symptoms.  Your medical history.  A physical exam.  Imaging tests, such as:  X-rays.  MRI.  Ultrasound. How is this treated? Treatment for this condition may include:  Resting your shoulder and avoiding all activities that cause pain or put stress on the shoulder.  Icing your shoulder.  NSAIDs to help reduce pain and swelling.  One or more injections of medicines to numb the area and reduce inflammation.  Physical therapy.  Surgery. This may be needed if nonsurgical treatments have not helped. Surgery may involve repairing the rotator cuff, reshaping the acromion, or removing the bursa. Follow these instructions at home: Managing pain, stiffness, and swelling  If directed, apply ice to the injured area.  Put ice in a plastic bag.  Place a towel between your skin and the bag.  Leave the ice on for 20 minutes, 2-3 times a day. Activity  Rest and return to your normal activities as told by your health care provider. Ask your health care provider what activities are safe for you.  Do exercises as told by your health care provider. General instructions  Do not use any tobacco products, including cigarettes, chewing tobacco, or e-cigarettes. Tobacco can delay healing. If you need help quitting, ask your health care provider.  Ask your health care provider when it is safe for you to drive.  Take over-the-counter and prescription medicines only as told by your health care provider.  Keep all follow-up visits as  told by your health care provider. This is important. How is this prevented?  Give your body time to rest between periods of activity.  Be safe and responsible while being active to avoid falls.  Maintain physical fitness, including strength and flexibility. Contact a health care provider if:  Your symptoms have not improved after 1-2 months of treatment and  rest.  You cannot lift your arm away from your body. This information is not intended to replace advice given to you by your health care provider. Make sure you discuss any questions you have with your health care provider. Document Released: 08/12/2005 Document Revised: 04/18/2016 Document Reviewed: 07/15/2015 Elsevier Interactive Patient Education  2017 Elsevier Inc.    Shoulder Impingement Syndrome Rehab Ask your health care provider which exercises are safe for you. Do exercises exactly as told by your health care provider and adjust them as directed. It is normal to feel mild stretching, pulling, tightness, or discomfort as you do these exercises, but you should stop right away if you feel sudden pain or your pain gets worse.Do not begin these exercises until told by your health care provider. Stretching and range of motion exercise This exercise warms up your muscles and joints and improves the movement and flexibility of your shoulder. This exercise also helps to relieve pain and stiffness. Exercise A: Passive horizontal adduction 1. Sit or stand and pull your left / right elbow across your chest, toward your other shoulder. Stop when you feel a gentle stretch in the back of your shoulder and upper arm.  Keep your arm at shoulder height.  Keep your arm as close to your body as you comfortably can. 2. Hold for __________ seconds. 3. Slowly return to the starting position. Repeat __________ times. Complete this exercise __________ times a day. Strengthening exercises These exercises build strength and endurance in your shoulder. Endurance is the ability to use your muscles for a long time, even after they get tired. Exercise B: External rotation, isometric 1. Stand or sit in a doorway, facing the door frame. 2. Bend your left / right elbow and place the back of your wrist against the door frame. Only your wrist should be touching the frame. Keep your upper arm at your  side. 3. Gently press your wrist against the door frame, as if you are trying to push your arm away from your abdomen.  Avoid shrugging your shoulder while you press your hand against the door frame. Keep your shoulder blade tucked down toward the middle of your back. 4. Hold for __________ seconds. 5. Slowly release the tension, and relax your muscles completely before you do the exercise again. Repeat __________ times. Complete this exercise __________ times a day. Exercise C: Internal rotation, isometric 1. Stand or sit in a doorway, facing the door frame. 2. Bend your left / right elbow and place the inside of your wrist against the door frame. Only your wrist should be touching the frame. Keep your upper arm at your side. 3. Gently press your wrist against the door frame, as if you are trying to push your arm toward your abdomen.  Avoid shrugging your shoulder while you press your hand against the door frame. Keep your shoulder blade tucked down toward the middle of your back. 4. Hold for __________ seconds. 5. Slowly release the tension, and relax your muscles completely before you do the exercise again. Repeat __________ times. Complete this exercise __________ times a day. Exercise D: Scapular protraction, supine 1. Lie on  your back on a firm surface. Hold a __________ weight in your left / right hand. 2. Raise your left / right arm straight into the air so your hand is directly above your shoulder joint. 3. Push the weight into the air so your shoulder lifts off of the surface that you are lying on. Do not move your head, neck, or back. 4. Hold for __________ seconds. 5. Slowly return to the starting position. Let your muscles relax completely before you repeat this exercise. Repeat __________ times. Complete this exercise __________ times a day. Exercise E: Scapular retraction 1. Sit in a stable chair without armrests, or stand. 2. Secure an exercise band to a stable object in  front of you so the band is at shoulder height. 3. Hold one end of the exercise band in each hand. Your palms should face down. 4. Squeeze your shoulder blades together and move your elbows slightly behind you. Do not shrug your shoulders while you do this. 5. Hold for __________ seconds. 6. Slowly return to the starting position. Repeat __________ times. Complete this exercise __________ times a day. Exercise F: Shoulder extension 1. Sit in a stable chair without armrests, or stand. 2. Secure an exercise band to a stable object in front of you where the band is above shoulder height. 3. Hold one end of the exercise band in each hand. 4. Straighten your elbows and lift your hands up to shoulder height. 5. Squeeze your shoulder blades together and pull your hands down to the sides of your thighs. Stop when your hands are straight down by your sides. Do not let your hands go behind your body. 6. Hold for __________ seconds. 7. Slowly return to the starting position. Repeat __________ times. Complete this exercise __________ times a day. This information is not intended to replace advice given to you by your health care provider. Make sure you discuss any questions you have with your health care provider. Document Released: 08/12/2005 Document Revised: 04/18/2016 Document Reviewed: 07/15/2015 Elsevier Interactive Patient Education  2017 [employer]

## 2016-10-02 NOTE — Progress Notes (Signed)
   Subjective:    I'm seeing this patient as a consultation for:  Sunnie NielsenNatalie Alexander, DO   CC: Right shoulder pain  HPI: Patient notes a two-month history of right shoulder pain. The pain is located in the upper lateral arm in the posterior aspect of the shoulder. Pain is resolved overhead motion reaching back and at night. She denies any radiating pain weakness or numbness. She can't recall any injury. She's been taking Tylenol and using higher stem as well as home exercise program dedicated for rotator cuff stabilization. She has a pertinent past history of left rotator cuff tear about 10 or 15 years ago. She notes pain is not similar to the previous rotator cuff tear.  Past medical history, Surgical history, Family history not pertinant except as noted below, Social history, Allergies, and medications have been entered into the medical record, reviewed, and no changes needed.   Review of Systems: No headache, visual changes, nausea, vomiting, diarrhea, constipation, dizziness, abdominal pain, skin rash, fevers, chills, night sweats, weight loss, swollen lymph nodes, body aches, joint swelling, muscle aches, chest pain, shortness of breath, mood changes, visual or auditory hallucinations.   Objective:    Vitals:   10/02/16 1455  BP: (!) 117/58  Pulse: 85   General: Well Developed, well nourished, and in no acute distress.  Neuro/Psych: Alert and oriented x3, extra-ocular muscles intact, able to move all 4 extremities, sensation grossly intact. Skin: Warm and dry, no rashes noted.  Respiratory: Not using accessory muscles, speaking in full sentences, trachea midline.  Cardiovascular: Pulses palpable, no extremity edema. Abdomen: Does not appear distended. MSK: Right shoulder is normal appearing Mildly tender to palpation overlying the acromioclavicular joint. Intact abduction external and internal rotation over painful abduction arc is present. Internal rotation limited to the lumbar  spine. Strength testing is intact over patient is slightly weak and painful with resisted external rotation. Mildly positive Hawkins and Neer's test. Negative empty can test. Negative Yergason's and speeds test.  Pulses capillary refill sensation intact distally.   No results found for this or any previous visit (from the past 24 hour(s)). No results found.  Impression and Recommendations:    Assessment and Plan: 51 y.o. female with Right shoulder pain very likely rotator cuff tendinopathy versus impingement. I think the most likely tendon involvement is the infraspinatus and possibly the posterior portion of the supraspinatus. I'm doubtful for full thickness rotator cuff tears patient has preserved strength. Patient would like to avoid injections today for possible. Plan for a trial of diclofenac gel as well as physical therapy and dedicated home physical therapy. Recheck in 4-6 weeks or so .   Discussed warning signs or symptoms. Please see discharge instructions. Patient expresses understanding.

## 2016-10-11 ENCOUNTER — Ambulatory Visit (INDEPENDENT_AMBULATORY_CARE_PROVIDER_SITE_OTHER): Payer: 59 | Admitting: Physical Therapy

## 2016-10-11 ENCOUNTER — Encounter: Payer: Self-pay | Admitting: Physical Therapy

## 2016-10-11 DIAGNOSIS — M25511 Pain in right shoulder: Secondary | ICD-10-CM | POA: Diagnosis not present

## 2016-10-11 DIAGNOSIS — M6281 Muscle weakness (generalized): Secondary | ICD-10-CM

## 2016-10-11 NOTE — Patient Instructions (Addendum)
Over Head Pull: Narrow Grip     [place] [phone], once a day.    On back, knees bent, feet flat, band across thighs, elbows straight but relaxed. Pull hands apart (start). Keeping elbows straight, bring arms up and over head, hands toward floor. Keep pull steady on band. Hold momentarily. Return slowly, keeping pull steady, back to start. Repeat _2-3 x10__ times. Band color __red____   Side Pull: Double Arm   On back, knees bent, feet flat. Arms perpendicular to body, shoulder level, elbows straight but relaxed. Pull arms out to sides, elbows straight. Resistance band comes across collarbones, hands toward floor. Squeeze shoulder blades together.  Hold momentarily. Slowly return to starting position. Repeat _2-3x10__ times. Band color __red___   Elisabeth CaraSash - thumb should be facing up when the hand is above your head.    On back, knees bent, feet flat, left hand on left hip, right hand above left. Pull right arm DIAGONALLY (hip to shoulder) across chest. Bring right arm along head toward floor. Hold momentarily. Slowly return to starting position. Repeat _2-3x10__ times. Do with left arm. Band color ___red___   Shoulder Rotation: Double Arm   On back, knees bent, feet flat, elbows tucked at sides, bent 90, hands palms up. Pull hands apart and down toward floor, keeping elbows near sides. Squeeze shoulder blades together.  Hold momentarily. Slowly return to starting position. Repeat _2-3 x10__ times. Band color __red____   Scapula Adduction With Pectorals, Mid-Range - draw shoulder blades down on back.    Stand in doorframe with palms against frame and arms at 90. Lean forward and squeeze shoulder blades. Hold __30_ seconds. Repeat _1__ times per session. Do _1_ sessions per day. Copyright  VHI. All rights reserved.   Supine isometric Rt shoulder ER 10x10sec as needed at night for pain.

## 2016-10-11 NOTE — Therapy (Signed)
[facility] [place] [address] [place] [address] [place], [place], [address] Phone: [phone]   Fax:  [phone]  Physical Therapy Evaluation  Patient Details  Name: Alexis Yu MRN: [id number] Date of Birth: 1965-09-17 Referring Provider: Dr Teressa Lower  Encounter Date: 10/11/2016      PT End of Session - 10/11/16 1337    Visit Number 1   Number of Visits 6   Date for PT Re-Evaluation 11/22/16   PT Start Time 1340   PT Stop Time 1439   PT Time Calculation (min) 59 min   Activity Tolerance Patient tolerated treatment well      Past Medical History:  Diagnosis Date  . Antiphospholipid antibody positive   . Asthma   . Pneumonia   . Uterine hyperplasia     Past Surgical History:  Procedure Laterality Date  . CESAREAN SECTION  1993    There were no vitals filed for this visit.       Subjective Assessment - 10/11/16 1343    Subjective Pt reports she developed Rt shoulder pain about 2 months ago of insidious onset, its getting progressively worse and now waking her at night.    Pertinent History Lt TRC tear 15 yrs ago ( nonsurgical treatment )    Diagnostic tests none   Patient Stated Goals return to working out fully ( cardio and machines at [employer]) restore her motion, decrease pain.    Currently in Pain? Yes   Pain Score 1    Pain Location Shoulder   Pain Orientation Right  anterior and posterior shoulder   Pain Descriptors / Indicators Dull  gets sharp and burning when pain wakes her.    Pain Type Acute pain   Pain Onset More than a month ago   Pain Frequency Constant   Aggravating Factors  sleeping, washing opposite shoulder, reaching for seatbelt   Pain Relieving Factors rest, medication             [facility] PT Assessment - 10/11/16 0001      Assessment   Medical Diagnosis Rt shoulder pain    Referring Provider Dr Teressa Lower   Onset Date/Surgical Date 08/10/16   Hand Dominance Right   Next MD Visit as needed   Prior Therapy no for Rt shoulder     Precautions   Precautions None     Balance Screen   Has the patient fallen in the past 6 months No     Prior Function   Level of Independence Independent   Vocation Full time employment   Multimedia programmer and preschool teacher   Leisure working out, sleep, gardening and scrapbooking     Observation/Other Assessments   Focus on Therapeutic Outcomes (FOTO)  39% limited     Posture/Postural Control   Posture/Postural Control Postural limitations   Postural Limitations Forward head  elevated Rt shoulder complex     ROM / Strength   AROM / PROM / Strength AROM;Strength     AROM   AROM Assessment Site Shoulder;Cervical  Rt shoulder reaching behind back to ~ T 10   Right/Left Shoulder --  bilat WNL however pain at endrange Rt all    Cervical Flexion WNl   Cervical Extension decreased 25%,slight tilt to the Lt    Cervical - Right Side Bend WNL   Cervical - Left Side Bend WNL   Cervical - Right Rotation WNL   Cervical - Left Rotation WNL     Strength  Strength Assessment Site Shoulder;Elbow  mid trap Rt 3+/5   Right/Left Shoulder --  WNL except ER Rt 3+/5 with pain, Lt 4/5   Right/Left Elbow --  bilat WNl     Palpation   Palpation comment tender posterior Rt shoulder and in bicepital groove                   [facility] Adult PT Treatment/Exercise - 10/11/16 0001      Exercises   Exercises Shoulder     Shoulder Exercises: Supine   Other Supine Exercises 2x10, red band, overhead pull, horizontal abd, SASH, ER  ER with yellow band     Shoulder Exercises: Stretch   Other Shoulder Stretches door way stretch mid x 30 sec                PT Education - 10/11/16 1337    Education provided Yes   Education Details HEP   Person(s) Educated Patient   Methods Explanation;Demonstration;Handout   Comprehension Returned demonstration;Verbalized understanding             PT Long Term Goals -  10/11/16 1336      PT LONG TERM GOAL #1   Title I with advanced HEP (11/22/16)    Time 6   Period Weeks   Status New     PT LONG TERM GOAL #2   Title report decreased Rt shoulder pain =/> 75% with holding her arm out to the side. ( 11/22/16)    Time 6   Period Weeks   Status New     PT LONG TERM GOAL #3   Title improve FOTO =/< 30% limited ( 11/22/16)    Time 6   Period Weeks   Status New     PT LONG TERM GOAL #4   Title demo upper back strength =/> 4+/5 to support the shoulder complex ( 11/22/16)    Time 6   Period Weeks   Status New     PT LONG TERM GOAL #5   Title increase Rt shoulder ER =/> 5-/5 ( 11/22/16)    Time 6   Period Weeks   Status New               Plan - 10/11/16 1441    Clinical Impression Statement 51 yo female presents for low complexity PT eval due to Rt shoulder pain. She has a h/o Lt RTC tear, conservative treatment. She presents with pain at endrange of Rt shoulder motion, weakness and postural changes.  She is a Tax advisergymnastic coach at one job and all her spotting is with the Rt arm, she is not able to do this now.    Rehab Potential Excellent   PT Frequency 1x / week   PT Duration 6 weeks   PT Treatment/Interventions Moist Heat;Ultrasound;Therapeutic exercise;Dry needling;Taping;Vasopneumatic Device;Manual techniques;Neuromuscular re-education;Cryotherapy;Electrical Stimulation;Patient/family education   PT Next Visit Plan progress RTC and scapular strengthening   Consulted and Agree with Plan of Care Patient      Patient will benefit from skilled therapeutic intervention in order to improve the following deficits and impairments:  Postural dysfunction, Decreased strength, Pain, Impaired UE functional use, Decreased range of motion  Visit Diagnosis: Acute pain of right shoulder - Plan: PT plan of care cert/re-cert  Muscle weakness (generalized) - Plan: PT plan of care cert/re-cert     Problem List Patient Active Problem List   Diagnosis  Date Noted  . Right shoulder pain 10/02/2016  . Mild intermittent asthma with  acute exacerbation 06/05/2016  . Lichenoid dermatitis 12/20/2015  . Perimenopause 09/27/2015  . Need for diphtheria-tetanus-pertussis (Tdap) vaccine, adult/adolescent 09/27/2015  . Medication management 09/27/2015  . Pelvic pain 01/06/2013  . Constipation, chronic 01/06/2013  . ASTHMA, NOS 04/18/2008  . OTHER AND UNSPECIFIED COAGULATION DEFECTS 02/24/2008  . ALLERGIC RHINITIS 02/24/2008  . HEADACHE, CHRONIC 02/24/2008  . PALPITATIONS, HX OF 02/23/2008    Roderic Scarce PT  10/11/2016, 2:48 PM  [facility] [address] [place] [address] [place], [place], [address] Phone: [phone]   Fax:  [phone]  Name: Alexis Yu MRN: [id number] Date of Birth: 1965/10/03

## 2016-10-18 ENCOUNTER — Ambulatory Visit (INDEPENDENT_AMBULATORY_CARE_PROVIDER_SITE_OTHER): Payer: 59 | Admitting: Physical Therapy

## 2016-10-18 DIAGNOSIS — M6281 Muscle weakness (generalized): Secondary | ICD-10-CM

## 2016-10-18 DIAGNOSIS — M25511 Pain in right shoulder: Secondary | ICD-10-CM

## 2016-10-18 NOTE — Therapy (Signed)
[facility] [address] [place] [address] [place], [place], [address] Phone: [phone]   Fax:  [phone]  Physical Therapy Treatment  Patient Details  Name: Alexis Yu MRN: [id number] Date of Birth: 11/10/1965 Referring Provider: Dr. Denyse Amassorey  Encounter Date: 10/18/2016      PT End of Session - 10/18/16 1439    Visit Number 2   Number of Visits 6   Date for PT Re-Evaluation 11/22/16   PT Start Time 1439   PT Stop Time 1533   PT Time Calculation (min) 54 min   Activity Tolerance Patient tolerated treatment well   Behavior During Therapy [facility] for tasks assessed/performed      Past Medical History:  Diagnosis Date  . Antiphospholipid antibody positive   . Asthma   . Pneumonia   . Uterine hyperplasia     Past Surgical History:  Procedure Laterality Date  . CESAREAN SECTION  1993    There were no vitals filed for this visit.      Subjective Assessment - 10/18/16 1439    Subjective Ameia reports she continues to have pain waking her in the night.  She used biofreeze last night and this helped some. She applies Voltaren as she is able.     Currently in Pain? Yes   Pain Score 1   up to 7/10 when supporting gymnasts.    Pain Location Shoulder   Pain Orientation Right   Pain Descriptors / Indicators Dull   Aggravating Factors  sleeping, reaching seatbelt, spotting gymnasts.    Pain Relieving Factors rest, biofreeze.             [facility] - 10/18/16 0001      Assessment   Medical Diagnosis Rt shoulder pain    Referring Provider Dr. Denyse Amassorey   Onset Date/Surgical Date 08/10/16   Hand Dominance Right   Next MD Visit as needed         [facility] Adult PT Treatment/Exercise - 10/18/16 0001      Self-Care   Self-Care Other Self-Care Comments;Heat/Ice Application   Heat/Ice Application Pt educated on ice massage to her Rt ant shoulder    Other Self-Care Comments  Pt educated on self massage with ball to post Rt  shoulder.  Pt verbalized understanding and returned demo.       Shoulder Exercises: Supine   Flexion Both;10 reps;Theraband  overhead pull; hooklying on full foam roll.    Theraband Level (Shoulder Flexion) Level 2 (Red)   Other Supine Exercises 2x10: horizontal abdct, SASH, ER   VC for arm position and to slow down. Red band   Other Supine Exercises hooklying on foam roller:  snow angels to ~90 deg x 5 reps.       Shoulder Exercises: Sidelying   Other Sidelying Exercises Rt sleeper stretch x 15 sec x 5 reps     Shoulder Exercises: ROM/Strengthening   UBE (Upper Arm Bike) L1: 2 min forward, 1 min backward.      Shoulder Exercises: Stretch   Other Shoulder Stretches door way stretch mid x 30 sec;  bicep stretch RUE x 30 sec     Modalities   Modalities Cryotherapy     Cryotherapy   Number Minutes Cryotherapy 5 Minutes   Cryotherapy Location Shoulder   Type of Cryotherapy Ice massage     Manual Therapy   Manual Therapy Soft tissue mobilization   Manual therapy comments Trial of rock tape applied to post deltoid to decompress tissue, decrease  pain, facilitate infraspinatus.   Soft tissue mobilization TPR to Rt infraspinatus, teres minor with contract/ relax into ER.                      PT Long Term Goals - 10/18/16 1507      PT LONG TERM GOAL #1   Title I with advanced HEP (11/22/16)    Time 6   Period Weeks   Status On-going     PT LONG TERM GOAL #2   Title report decreased Rt shoulder pain =/> 75% with holding her arm out to the side. ( 11/22/16)    Time 6   Period Weeks   Status On-going     PT LONG TERM GOAL #4   Title demo upper back strength =/> 4+/5 to support the shoulder complex ( 11/22/16)    Time 6   Period Weeks   Status On-going     PT LONG TERM GOAL #5   Title increase Rt shoulder ER =/> 5-/5 ( 11/22/16)    Time 6   Period Weeks   Status On-going               Plan - 10/18/16 1447    Clinical Impression Statement Pt had  difficulty tolerating doorway stretch due to increased pain in post shoulder. She had less discomfort with red band exercises today.  Pt had some relief with manual therapy to posterior shoulder.  Pt making progress towards goals.  Pt will benefit from continued PT to maximize function.    Rehab Potential Excellent   PT Duration 6 weeks   PT Treatment/Interventions Moist Heat;Ultrasound;Therapeutic exercise;Dry needling;Taping;Vasopneumatic Device;Manual techniques;Neuromuscular re-education;Cryotherapy;Electrical Stimulation;Patient/family education   PT Next Visit Plan will speak to supervising PT regarding possible ionto treatment to Rt shoulder.   Continue progressive strengthening/stretching to Rt shoulde.r    Consulted and Agree with Plan of Care Patient      Patient will benefit from skilled therapeutic intervention in order to improve the following deficits and impairments:     Visit Diagnosis: Acute pain of right shoulder  Muscle weakness (generalized)     Problem List Patient Active Problem List   Diagnosis Date Noted  . Right shoulder pain 10/02/2016  . Mild intermittent asthma with acute exacerbation 06/05/2016  . Lichenoid dermatitis 12/20/2015  . Perimenopause 09/27/2015  . Need for diphtheria-tetanus-pertussis (Tdap) vaccine, adult/adolescent 09/27/2015  . Medication management 09/27/2015  . Pelvic pain 01/06/2013  . Constipation, chronic 01/06/2013  . ASTHMA, NOS 04/18/2008  . OTHER AND UNSPECIFIED COAGULATION DEFECTS 02/24/2008  . ALLERGIC RHINITIS 02/24/2008  . HEADACHE, CHRONIC 02/24/2008  . PALPITATIONS, HX OF 02/23/2008   Mayer Camel, PTA 10/18/16 4:18 PM  [facility] [place] [address] [place] [address] [place], [place], [address] Phone: [phone]   Fax:  [phone]  Name: Alexis Yu MRN: [id number] Date of Birth: 19-Jul-1966

## 2016-10-28 ENCOUNTER — Ambulatory Visit (INDEPENDENT_AMBULATORY_CARE_PROVIDER_SITE_OTHER): Payer: 59 | Admitting: Physical Therapy

## 2016-10-28 ENCOUNTER — Encounter: Payer: Self-pay | Admitting: Physical Therapy

## 2016-10-28 DIAGNOSIS — M25511 Pain in right shoulder: Secondary | ICD-10-CM

## 2016-10-28 DIAGNOSIS — M6281 Muscle weakness (generalized): Secondary | ICD-10-CM | POA: Diagnosis not present

## 2016-10-28 NOTE — Therapy (Signed)
[place], [place], [address] Phone: [phone]   Fax:  [phone]  Physical Therapy Treatment  Patient Details  Name: Alexis Yu MRN: [id number] Date of Birth: 02-Feb-1966 Referring Provider: Dr. Georgina Snell  Encounter Date: 10/28/2016      PT End of Session - 10/28/16 1521    Visit Number 3   Number of Visits 6   Date for PT Re-Evaluation 11/22/16   PT Start Time 3662   PT Stop Time 1602   PT Time Calculation (min) 41 min   Activity Tolerance Patient tolerated treatment well      Past Medical History:  Diagnosis Date  . Antiphospholipid antibody positive   . Asthma   . Pneumonia   . Uterine hyperplasia     Past Surgical History:  Procedure Laterality Date  . CESAREAN SECTION  1993    There were no vitals filed for this visit.      Subjective Assessment - 10/28/16 1522    Subjective Pt reports the shoulder feels better doing the day however still having symptoms at night when she lies on the right side. She doesn't feel like the tape helped.    Patient Stated Goals return to working out fully ( cardio and machines at [employer]) restore her motion, decrease pain.    Currently in Pain? No/denies            [facility] PT Assessment - 10/28/16 0001      Assessment   Medical Diagnosis Rt shoulder pain      Strength   Right/Left Shoulder --  Lt ER WNL, Rt 4+/5                     [facility] Adult PT Treatment/Exercise - 10/28/16 0001      Shoulder Exercises: Prone   Horizontal ABduction 2 Limitations --   Other Prone Exercises 20 reps Rt UE off EOB rows - 3#, T's - 1# - pt had trouble coodinating these.  's 1# 0     Shoulder Exercises: Sidelying   External Rotation Strengthening;Right;10 reps;Weights  3 sets, towel under elbow   External Rotation Weight (lbs) 3   Other Sidelying Exercises 3x10 empty can in small ROM, 2#     Shoulder Exercises: Standing   External  Rotation Right;15 reps  dribbling ball with shoulder abducted     Shoulder Exercises: ROM/Strengthening   UBE (Upper Arm Bike) L2x4' alt FWD/BWD     Shoulder Exercises: Stretch   Other Shoulder Stretches with strap in door pecs bilat, single, shoulder flex.      Shoulder Exercises: Body Blade   Flexion --  10 reps Rt UE                PT Education - 10/28/16 1526    Education provided Yes   Education Details advance HEP to green band   Person(s) Educated Patient   Methods Explanation   Comprehension Verbalized understanding             PT Long Term Goals - 10/28/16 1531      PT LONG TERM GOAL #1   Title I with advanced HEP (11/22/16)    Status On-going     PT LONG TERM GOAL #2   Title report decreased Rt shoulder pain =/> 75% with holding her arm out to the side. ( 11/22/16)    Status On-going  50% improved.      PT LONG TERM  GOAL #3   Title improve FOTO =/< 30% limited ( 11/22/16)    Status On-going     PT LONG TERM GOAL #4   Title demo upper back strength =/> 4+/5 to support the shoulder complex ( 11/22/16)    Status On-going     PT LONG TERM GOAL #5   Title increase Rt shoulder ER =/> 5-/5 ( 11/22/16)    Status Partially Met  met on Lt side, progressing on Rt                Plan - 10/28/16 1610    Clinical Impression Statement Alexis Yu is feeling progress, met a goal and progressing to others. She has improved strength in her shoulder, upper back is still weak especially when she has activites where her arm is abducted.    Rehab Potential Excellent   PT Frequency 1x / week   PT Duration 6 weeks   PT Treatment/Interventions Moist Heat;Ultrasound;Therapeutic exercise;Dry needling;Taping;Vasopneumatic Device;Manual techniques;Neuromuscular re-education;Cryotherapy;Electrical Stimulation;Patient/family education   PT Next Visit Plan will hold on ionto for now as she feels improvement. May need to revisit    Consulted and Agree with Plan of Care  Patient      Patient will benefit from skilled therapeutic intervention in order to improve the following deficits and impairments:  Postural dysfunction, Decreased strength, Pain, Impaired UE functional use, Decreased range of motion  Visit Diagnosis: Acute pain of right shoulder  Muscle weakness (generalized)     Problem List Patient Active Problem List   Diagnosis Date Noted  . Right shoulder pain 10/02/2016  . Mild intermittent asthma with acute exacerbation 06/05/2016  . Lichenoid dermatitis 12/20/2015  . Perimenopause 09/27/2015  . Need for diphtheria-tetanus-pertussis (Tdap) vaccine, adult/adolescent 09/27/2015  . Medication management 09/27/2015  . Pelvic pain 01/06/2013  . Constipation, chronic 01/06/2013  . ASTHMA, NOS 04/18/2008  . OTHER AND UNSPECIFIED COAGULATION DEFECTS 02/24/2008  . ALLERGIC RHINITIS 02/24/2008  . HEADACHE, CHRONIC 02/24/2008  . PALPITATIONS, HX OF 02/23/2008    Jeral Pinch PT 10/28/2016, [place], [place], [address] Phone: [phone]   Fax:  [phone]  Name: Alexis Yu MRN: [id number] Date of Birth: 1966-04-18

## 2016-11-04 ENCOUNTER — Encounter: Payer: 59 | Admitting: Rehabilitative and Restorative Service Providers"

## 2016-11-11 ENCOUNTER — Ambulatory Visit (INDEPENDENT_AMBULATORY_CARE_PROVIDER_SITE_OTHER): Payer: 59 | Admitting: Physical Therapy

## 2016-11-11 DIAGNOSIS — M6281 Muscle weakness (generalized): Secondary | ICD-10-CM | POA: Diagnosis not present

## 2016-11-11 DIAGNOSIS — M25511 Pain in right shoulder: Secondary | ICD-10-CM

## 2016-11-11 NOTE — Therapy (Signed)
[place], [place], [address] Phone: [phone]   Fax:  [phone]  Physical Therapy Treatment  Patient Details  Name: Alexis Yu MRN: [id number] Date of Birth: Dec 21, 1965 Referring Provider: Dr. Georgina Snell   Encounter Date: 11/11/2016      PT End of Session - 11/11/16 1523    Visit Number 4   Number of Visits 6   Date for PT Re-Evaluation 11/22/16   PT Start Time 1520   PT Stop Time 1613   PT Time Calculation (min) 53 min      Past Medical History:  Diagnosis Date  . Antiphospholipid antibody positive   . Asthma   . Pneumonia   . Uterine hyperplasia     Past Surgical History:  Procedure Laterality Date  . CESAREAN SECTION  1993    There were no vitals filed for this visit.      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  "I was doing well, now I feel like I'm going backwards".     Currently in Pain? Yes   Pain Score 3   up to 8/10.    Pain Location Shoulder   Pain Orientation Right   Aggravating Factors  sleeping, spotting in gymnastics,    Pain Relieving Factors ice, heat, biofreeze.             [facility] PT Assessment - 11/11/16 0001      Assessment   Medical Diagnosis Rt shoulder pain    Referring Provider Dr. Georgina Snell    Onset Date/Surgical Date 08/10/16   Hand Dominance Right   Next MD Visit as needed     AROM   Right/Left Shoulder Right          [facility] Adult PT Treatment/Exercise - 11/11/16 0001      Self-Care   Other Self-Care Comments  Pt re-educated on self massage with ball/wall to post Rt shoulder.  Pt verbalized understanding and returned demo.       Shoulder Exercises: Supine   Horizontal ABduction Strengthening;Both;12 reps;Theraband   Theraband Level (Shoulder Horizontal ABduction) Level 1 (Yellow)   Flexion Both;10 reps;Theraband   overhead pull; hooklying    Theraband Level (Shoulder Flexion) Level 1 (Yellow)   Other Supine Exercises sash LUE, yellow band x 10    Other Supine Exercises hooklying on foam roller:  snow angels to ~70 deg x 10 reps, then prolonged stretch @ 70 deg; scap squeeze x 5 sec x 10 reps.       Shoulder Exercises: ROM/Strengthening   UBE (Upper Arm Bike) L1: 2 min forward/ 2 min backward, standing     Shoulder Exercises: Stretch   Other Shoulder Stretches Unable to tolerate doorway stretch; switched to supine      Modalities   Modalities Electrical Stimulation;Vasopneumatic     Electrical Stimulation   Electrical Stimulation Location Rt shoulder   Electrical Stimulation Action IFC   Electrical Stimulation Parameters to tolerance   Electrical Stimulation Goals Pain     Vasopneumatic   Number Minutes Vasopneumatic  15 minutes   Vasopnuematic Location  Shoulder   Vasopneumatic Pressure Low   Vasopneumatic Temperature  34 deg     Manual Therapy   Manual Therapy Soft tissue mobilization   Soft tissue mobilization TPR to Rt infraspinatus, teres minor with contract/ relax into ER.  cross fiber to  thoracic paraspinals.        Education:  Pt given information of TENS unit. (handout), informed on use and application.  Pt verbalized understanding.         PT Long Term Goals - 11/11/16 1659      PT LONG TERM GOAL #1   Title I with advanced HEP (11/22/16)    Time 6   Period Weeks   Status On-going     PT LONG TERM GOAL #2   Title report decreased Rt shoulder pain =/> 75% with holding her arm out to the side. 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  She tolerated exercises with reduced resistance with minimal increase in pain.  Pt reported reduction in pain with use of vaso and estim at end of session.  No goals met today due to recent flare up.     Rehab Potential Excellent   PT Frequency 1x / week   PT Duration 6 weeks   PT Treatment/Interventions Moist Heat;Ultrasound;Therapeutic exercise;Dry needling;Taping;Vasopneumatic Device;Manual techniques;Neuromuscular re-education;Cryotherapy;Electrical Stimulation;Patient/family education   PT Next Visit Plan continue Rt shoulder ROM/strengthening.  modalities as needed.    Consulted and Agree with Plan of Care Patient      Patient will benefit from skilled therapeutic intervention in order to improve the following deficits and impairments:  Postural dysfunction, Decreased strength, Pain, Impaired UE functional use, Decreased range of motion  Visit Diagnosis: Acute pain of right shoulder  Muscle weakness (generalized)     Problem List Patient Active Problem List   Diagnosis Date Noted  . Right shoulder pain 10/02/2016  . Mild intermittent asthma with acute exacerbation 06/05/2016  . Lichenoid dermatitis 12/20/2015  . Perimenopause 09/27/2015  . Need for diphtheria-tetanus-pertussis (Tdap) vaccine, adult/adolescent 09/27/2015  . Medication management 09/27/2015  . Pelvic pain 01/06/2013  . Constipation, chronic 01/06/2013  . ASTHMA, NOS 04/18/2008  . OTHER AND UNSPECIFIED COAGULATION DEFECTS 02/24/2008  . ALLERGIC RHINITIS 02/24/2008  . HEADACHE, CHRONIC 02/24/2008  . PALPITATIONS, HX OF 02/23/2008   Kerin Perna, PTA 11/11/16 5:04 PM  [place] [address], [place], [address] Phone: [phone]   Fax:  [phone]  Name: Alexis Yu MRN: [id number] Date of Birth: [birth year]

## 2016-11-18 ENCOUNTER — Ambulatory Visit (INDEPENDENT_AMBULATORY_CARE_PROVIDER_SITE_OTHER): Payer: 59 | Admitting: Physical Therapy

## 2016-11-18 DIAGNOSIS — M25511 Pain in right shoulder: Secondary | ICD-10-CM | POA: Diagnosis not present

## 2016-11-18 DIAGNOSIS — M6281 Muscle weakness (generalized): Secondary | ICD-10-CM | POA: Diagnosis not present

## 2016-11-18 NOTE — Therapy (Addendum)
[place], [place], [address] Phone: [phone]   Fax:  [phone]  Physical Therapy Treatment  Patient Details  Name: Alexis Yu MRN: [id number] Date of Birth: 1966-03-21 Referring Provider: Dr. Georgina Snell  Encounter Date: 11/18/2016      PT End of Session - 11/18/16 1605    Visit Number 5   Number of Visits 6   Date for PT Re-Evaluation 11/22/16   PT Start Time 1007   PT Stop Time 1615   PT Time Calculation (min) 60 min      Past Medical History:  Diagnosis Date  . Antiphospholipid antibody positive   . Asthma   . Pneumonia   . Uterine hyperplasia     Past Surgical History:  Procedure Laterality Date  . CESAREAN SECTION  1993    There were no vitals filed for this visit.      Subjective Assessment - 11/18/16 1519    Subjective Pt reports her Rt shoulder popped 2x in middle of night, "It jolted me out of my sleep, laying on Lt side".  She is unable to comfortably lay on Rt side.  Her shoulder has been sore, but not as bad as last session.  She has been using analgesic balm, ice, and TENS for pain relief.   She painted walls this weekend, with irritation in shoulder, but nothing debilitating. She is worried that some worse is going on inside her shoulder.    Patient Stated Goals return to working out fully ( cardio and machines at [employer]) restore her motion, decrease pain.    Currently in Pain? Yes   Pain Score 1 (up to 1/21 with certain motions)   Pain Location Shoulder   Pain Orientation Right   Pain Descriptors / Indicators Dull   Aggravating Factors  sleeping, spotting in gymnastics   Pain Relieving Factors ice, heat, biofreeze            [facility] PT Assessment - 11/18/16 0001      Assessment   Medical Diagnosis Rt shoulder pain    Referring Provider Dr. Georgina Snell   Onset Date/Surgical Date 08/10/16   Hand Dominance Right   Next MD Visit as needed     AROM   AROM  Assessment Site Shoulder   Right/Left Shoulder Right   Right Shoulder Flexion --  less than Lt by 10 deg, with pain at end range.           [facility] Adult PT Treatment/Exercise - 11/18/16 0001      Shoulder Exercises: Supine   Flexion Both;AROM;20 reps   Theraband Level (Shoulder Flexion) Level 1 (Yellow)   Flexion Limitations pain at end range.    Other Supine Exercises sash LUE, yellow band x 10    Other Supine Exercises hooklying on foam roller:  snow angels to ~70 deg x 10 reps, then prolonged stretch @ 70 deg; scap squeeze x 5 sec x 10 reps.       Shoulder Exercises: Stretch   Other Shoulder Stretches Unable to tolerate doorway stretch; switched to supine      Electrical Stimulation   Electrical Stimulation Location Rt shoulder   Electrical Stimulation Action IFC   Electrical Stimulation Parameters to tolerance    Electrical Stimulation Goals Pain     Vasopneumatic   Number Minutes Vasopneumatic  15 minutes   Vasopnuematic Location  Shoulder   Vasopneumatic Pressure Low   Vasopneumatic Temperature  34 deg  Manual Therapy   Manual Therapy Soft tissue mobilization   Soft tissue mobilization TPR to Rt infraspinatus, Rt subscap with active IR, Rt levator, Rt prox tricep                      PT Long Term Goals - 11/11/16 1659      PT LONG TERM GOAL #1   Title I with advanced HEP (11/22/16)    Time 6   Period Weeks   Status On-going     PT LONG TERM GOAL #2   Title report decreased Rt shoulder pain =/> 75% with holding her arm out to the side. ( 11/22/16)    Time 6   Period Weeks   Status On-going     PT LONG TERM GOAL #3   Title improve FOTO =/< 30% limited ( 11/22/16)    Time 6   Period Weeks   Status On-going     PT LONG TERM GOAL #4   Title demo upper back strength =/> 4+/5 to support the shoulder complex ( 11/22/16)    Time 6   Period Weeks   Status On-going     PT LONG TERM GOAL #5   Title increase Rt shoulder ER =/> 5-/5 ( 11/22/16)     Time 6   Period Weeks   Status Partially Met               Plan - 11/18/16 1656    Clinical Impression Statement Pt with continued flare up, although not as bad.  She has pain in Rt shoulder with end range ER, flexion, IR.  She was point tender with manual therapy to Rt subscapularis, Rt teres minor.  Pt tolerated exercise with min resistance today.  Symptoms reduced with use of estim/vaso at end of session.  Pt may benefit from revisit to MD for further testing.    Rehab Potential Excellent   PT Frequency 1x / week   PT Duration 6 weeks   PT Treatment/Interventions Moist Heat;Ultrasound;Therapeutic exercise;Dry needling;Taping;Vasopneumatic Device;Manual techniques;Neuromuscular re-education;Cryotherapy;Electrical Stimulation;Patient/family education   PT Next Visit Plan Possibly try dry needling to Rt shoulder musculature; stretching/strengthening as tolerated.    Consulted and Agree with Plan of Care Patient      Patient will benefit from skilled therapeutic intervention in order to improve the following deficits and impairments:  Postural dysfunction, Decreased strength, Pain, Impaired UE functional use, Decreased range of motion  Visit Diagnosis: Acute pain of right shoulder  Muscle weakness (generalized)     Problem List Patient Active Problem List   Diagnosis Date Noted  . Right shoulder pain 10/02/2016  . Mild intermittent asthma with acute exacerbation 06/05/2016  . Lichenoid dermatitis 12/20/2015  . Perimenopause 09/27/2015  . Need for diphtheria-tetanus-pertussis (Tdap) vaccine, adult/adolescent 09/27/2015  . Medication management 09/27/2015  . Pelvic pain 01/06/2013  . Constipation, chronic 01/06/2013  . ASTHMA, NOS 04/18/2008  . OTHER AND UNSPECIFIED COAGULATION DEFECTS 02/24/2008  . ALLERGIC RHINITIS 02/24/2008  . HEADACHE, CHRONIC 02/24/2008  . PALPITATIONS, HX OF 02/23/2008    Shelbie Hutching 11/18/2016, 5:01 PM  [facility] [place], [place], [address] Phone: [phone]   Fax:  [phone]  Name: Alexis Yu MRN: [id number] Date of Birth: 1966-06-10  PHYSICAL THERAPY DISCHARGE SUMMARY  Visits from Start of Care: 5  Current functional level related to goals / functional outcomes: See above, also pt had an MRI and it shows labral tear, RTC  tear, She is being referred to an ortho MD for surgery.    Remaining deficits: See above   Education / Equipment: HEP Plan: Patient agrees to discharge.  Patient goals were not met. Patient is being discharged due to a change in medical status.  ?????     Jeral Pinch, PT 11/27/16 3:35 PM

## 2016-11-19 ENCOUNTER — Ambulatory Visit (INDEPENDENT_AMBULATORY_CARE_PROVIDER_SITE_OTHER): Payer: 59 | Admitting: Family Medicine

## 2016-11-19 ENCOUNTER — Ambulatory Visit (INDEPENDENT_AMBULATORY_CARE_PROVIDER_SITE_OTHER): Payer: 59

## 2016-11-19 VITALS — BP 116/51 | HR 70

## 2016-11-19 DIAGNOSIS — M25511 Pain in right shoulder: Secondary | ICD-10-CM

## 2016-11-19 NOTE — Patient Instructions (Signed)
Thank you for coming in today. Get MRI of the shoulder.  Return in 1-2 days after the MRI.   Returns sooner if needed.

## 2016-11-20 NOTE — Progress Notes (Signed)
Alexis Yu is a 51 y.o. female who [facility] today for follow-up shoulder pain. Patient was seen in early February for right shoulder pain. This was thought to be due to rotator cuff tendinopathy. She had a trial of physical therapy and notes that her pain is the same to potentially worse. She notes posterior shoulder pain worse with activity. She's not done well previously with steroid injections and would like to avoid steroid injections if possible. She denies any radiating pain weakness or numbness.   Past Medical History:  Diagnosis Date  . Antiphospholipid antibody positive   . Asthma   . Pneumonia   . Uterine hyperplasia    Past Surgical History:  Procedure Laterality Date  . CESAREAN SECTION  1993   Social History  Substance Use Topics  . Smoking status: Never Smoker  . Smokeless tobacco: Never Used  . Alcohol use Yes     Comment: occassional     ROS:  As above   Medications: Current Outpatient Prescriptions  Medication Sig Dispense Refill  . albuterol (PROVENTIL HFA;VENTOLIN HFA) 108 (90 Base) MCG/ACT inhaler Inhale 2 puffs into the lungs every 6 (six) hours as needed for wheezing. 1 Inhaler 6  . diclofenac sodium (VOLTAREN) 1 % GEL Apply 4 g topically 4 (four) times daily. To affected joint. 100 g 11  . Green Tea, Camillia sinensis, (GREEN TEA PO) Take 400 mg by mouth 2 (two) times daily.    Marland Kitchen. omeprazole (PRILOSEC) 40 MG capsule Take 1 capsule (40 mg total) by mouth daily. 30 capsule 3  . Spacer/Aero-Holding Chambers (E-Z SPACER) inhaler Use as instructed 1 each 2  . zoster vaccine live, PF, (ZOSTAVAX) [id number] UNT/0.65ML injection Inject 19,400 Units into the skin once. 1 each 0   No current facility-administered medications for this visit.    No Known Allergies   Exam:  BP (!) 116/51   Pulse 70   LMP 12/30/2012  General: Well Developed, well nourished, and in no acute distress.  Neuro/Psych: Alert  and oriented x3, extra-ocular muscles intact, able to move all 4 extremities, sensation grossly intact. Skin: Warm and dry, no rashes noted.  Respiratory: Not using accessory muscles, speaking in full sentences, trachea midline.  Cardiovascular: Pulses palpable, no extremity edema. Abdomen: Does not appear distended. MSK: Right shoulder is normal appearing Mildly tender to palpation overlying the acromioclavicular joint. Intact abduction external and internal rotation  painful abduction arc is present. Internal rotation limited to the lumbar spine. Strength testing is intact abduction is slightly weak and painful with resisted external rotation. Mildly positive Hawkins and Neer's test. Negative empty can test. Negative Yergason's and speeds test.    No results found for this or any previous visit (from the past 48 hour(s)). Dg Shoulder Right  Result Date: 11/19/2016 CLINICAL DATA:  Right shoulder pain starting October 2017 EXAM: RIGHT SHOULDER - 2+ VIEW COMPARISON:  None. FINDINGS: Three views of the right shoulder submitted. No acute fracture or subluxation. No pathologic calcifications are noted. AC joint and glenohumeral joint are preserved. IMPRESSION: Negative. Electronically Signed   By: Natasha MeadLiviu  Pop M.D.   On: 11/19/2016 16:46      Assessment and Plan: 51 y.o. female with continued right shoulder pain after failure of physical therapy. Plan for MRI to further evaluate causes pain. Recheck after MRI.    Orders Placed This Encounter  Procedures  . MR SHOULDER RIGHT WO CONTRAST    Standing Status:   Future  Standing Expiration Date:   01/19/2018    Order Specific Question:   Does the patient have a pacemaker or implanted devices?    Answer:   No    Order Specific Question:   Preferred imaging location?    Answer:   [facility] (table limit-350lbs)    Order Specific Question:   Reason for exam:    Answer:   eval rt shoulder pain. suspect infraspinatus tear  . DG  Shoulder Right    Please include true AP (Grashey view), scapular Y, and axillary views    Standing Status:   Future    Number of Occurrences:   1    Standing Expiration Date:   01/19/2018    Order Specific Question:   Preferred imaging location?    Answer:   [facility]    Comments:   Please include true AP (Grashey view), scapular Y, and axillary views    Order Specific Question:   Reason for exam:    Answer:   Please include true AP (Grashey view), scapular Y, and axillary views    Comments:   Please include true AP (Grashey view), scapular Y, and axillary views    Discussed warning signs or symptoms. Please see discharge instructions. Patient expresses understanding.

## 2016-11-25 ENCOUNTER — Ambulatory Visit (INDEPENDENT_AMBULATORY_CARE_PROVIDER_SITE_OTHER): Payer: 59

## 2016-11-25 DIAGNOSIS — M75101 Unspecified rotator cuff tear or rupture of right shoulder, not specified as traumatic: Secondary | ICD-10-CM | POA: Diagnosis not present

## 2016-11-25 DIAGNOSIS — S43491A Other sprain of right shoulder joint, initial encounter: Secondary | ICD-10-CM

## 2016-11-25 DIAGNOSIS — M25511 Pain in right shoulder: Secondary | ICD-10-CM

## 2016-11-25 DIAGNOSIS — M7581 Other shoulder lesions, right shoulder: Secondary | ICD-10-CM | POA: Diagnosis not present

## 2016-11-25 DIAGNOSIS — X58XXXA Exposure to other specified factors, initial encounter: Secondary | ICD-10-CM | POA: Diagnosis not present

## 2016-11-27 ENCOUNTER — Ambulatory Visit (INDEPENDENT_AMBULATORY_CARE_PROVIDER_SITE_OTHER): Payer: 59 | Admitting: Family Medicine

## 2016-11-27 ENCOUNTER — Encounter: Payer: 59 | Admitting: Physical Therapy

## 2016-11-27 DIAGNOSIS — S43439A Superior glenoid labrum lesion of unspecified shoulder, initial encounter: Secondary | ICD-10-CM | POA: Insufficient documentation

## 2016-11-27 DIAGNOSIS — M75100 Unspecified rotator cuff tear or rupture of unspecified shoulder, not specified as traumatic: Secondary | ICD-10-CM | POA: Insufficient documentation

## 2016-11-27 DIAGNOSIS — M75111 Incomplete rotator cuff tear or rupture of right shoulder, not specified as traumatic: Secondary | ICD-10-CM | POA: Diagnosis not present

## 2016-11-27 DIAGNOSIS — S43431A Superior glenoid labrum lesion of right shoulder, initial encounter: Secondary | ICD-10-CM

## 2016-11-27 MED ORDER — TRAMADOL HCL 50 MG PO TABS: 50.0000 mg | ORAL_TABLET | Freq: Three times a day (TID) | ORAL | 0 refills | Status: DC | PRN

## 2016-11-27 NOTE — Progress Notes (Signed)
Alexis Yu is a 51 y.o. female who presents to [facility] today for  Follow-up shoulder MRI. Patient has been dealing with right shoulder pain since early February. She has had a trial of conservative management and has failed to improve. She had a MRI 2 days ago showing a SLAP lesion and a small RTC tear and RTC tendonitis. She would like to avoid an injection if possible. She works at [employer] and her current shoulder injury is limiting her ability to do her job.  Past Medical History:  Diagnosis Date  . Antiphospholipid antibody positive   . Asthma   . Pneumonia   . Uterine hyperplasia    Past Surgical History:  Procedure Laterality Date  . CESAREAN SECTION  1993   Social History  Substance Use Topics  . Smoking status: Never Smoker  . Smokeless tobacco: Never Used  . Alcohol use Yes     Comment: occassional     ROS:  As above   Medications: Current Outpatient Prescriptions  Medication Sig Dispense Refill  . albuterol (PROVENTIL HFA;VENTOLIN HFA) 108 (90 Base) MCG/ACT inhaler Inhale 2 puffs into the lungs every 6 (six) hours as needed for wheezing. 1 Inhaler 6  . diclofenac sodium (VOLTAREN) 1 % GEL Apply 4 g topically 4 (four) times daily. To affected joint. 100 g 11  . Green Tea, Camillia sinensis, (GREEN TEA PO) Take 400 mg by mouth 2 (two) times daily.    Marland Kitchen omeprazole (PRILOSEC) 40 MG capsule Take 1 capsule (40 mg total) by mouth daily. 30 capsule 3  . Spacer/Aero-Holding Chambers (E-Z SPACER) inhaler Use as instructed 1 each 2  . zoster vaccine live, PF, (ZOSTAVAX) 16109 UNT/0.65ML injection Inject 19,400 Units into the skin once. 1 each 0  . traMADol (ULTRAM) 50 MG tablet Take 1 tablet (50 mg total) by mouth every 8 (eight) hours as needed. 15 tablet 0   No current facility-administered medications for this visit.    No Known Allergies   Exam:  BP (!) 116/51   Pulse 70   LMP 12/30/2012  General: Well  Developed, well nourished, and in no acute distress.  Neuro/Psych: Alert and oriented x3, extra-ocular muscles intact, able to move all 4 extremities, sensation grossly intact. Skin: Warm and dry, no rashes noted.  Respiratory: Not using accessory muscles, speaking in full sentences, trachea midline.  Cardiovascular: Pulses palpable, no extremity edema. Abdomen: Does not appear distended. MSK: Right shoulder pain with motion.    No results found for this or any previous visit (from the past 48 hour(s)). Mr Shoulder Right Wo Contrast  Result Date: 11/25/2016 CLINICAL DATA:  Right shoulder pain for several months. No known injury. EXAM: MRI OF THE RIGHT SHOULDER WITHOUT CONTRAST TECHNIQUE: Multiplanar, multisequence MR imaging of the shoulder was performed. No intravenous contrast was administered. COMPARISON:  None. FINDINGS: Rotator cuff: Partial articular surface tear of the footplate and crescent portion of the distal supraspinatus measuring 8 mm coronal oblique by 9 mm AP, series 4, image 11 and series 7, image 15. Mild thickening of the infraspinatus consistent with tendinopathy. The subscapularis and teres minor are intact. Muscles:  No muscle atrophy or retraction. Biceps long head:  Intact Acromioclavicular Joint: Mild arthropathy of the acromioclavicular joint. Type I acromion. Trace subacromial and subdeltoid bursal fluid. Glenohumeral Joint: Small joint effusion with partial thinning of the humeral head and glenoid cartilage. No focal chondral defect. Labrum: SLAP tear of the glenoid labrum from 11-1 o'clock ,  series 4, image 13 and series 8, image 8. Bones: Subcortical degenerate humeral head cysts anterosuperiorly, series 7, image 11. Mild reactive marrow edema about the [facility] joint. Other: None. IMPRESSION: 1. SLAP tear of the glenoid labrum from approximately the 11- 1 o'clock position. 2. Partial articular surface tear of the footplate and crescent portion of the distal supraspinatus  measuring 8 mm coronal oblique by 9 mm AP. 3. Infraspinatus tendinopathy. 4. Mild partial thickness thinning of the glenohumeral joint cartilage. 5. Trace subacromial and subdeltoid bursal fluid. Findings may represent mild bursitis. Fluid from full-thickness microperforation cannot be entirely excluded of the rotator cuff. Electronically Signed   By: Tollie Eth M.D.   On: 11/25/2016 17:48      Assessment and Plan: 51 y.o. female with recent shoulder pain due to SLAP tear as well as supraspinatus tear. Discussed options. At this point I think it's worthwhile to have a consultation with orthopedic surgery. I'm doubtful that an injection would make much of a difference. She has failed conservative management. Refer to Dr. Dion Saucier Return PCP.     Orders Placed This Encounter  Procedures  . Ambulatory referral to Orthopedic Surgery    Referral Priority:   Routine    Referral Type:   Surgical    Referral Reason:   Specialty Services Required    Referred to Provider:   Teryl Lucy, MD    Requested Specialty:   Orthopedic Surgery    Number of Visits Requested:   1    Discussed warning signs or symptoms. Please see discharge instructions. Patient expresses understanding.  CC: Sunnie Nielsen, DO Dr Dion Saucier

## 2016-11-27 NOTE — Patient Instructions (Signed)
Thank you for coming in today. You should hear from Dr Shelba Flake office soon.  Let me know if you are having a problem.  Use tramadol sparingly.    SLAP Lesions Superior labrum anterior posterior (SLAP) lesions are injuries to part of the connective tissue (cartilage) of the shoulder joint. The top of the upper arm bone (humerus) fits into a socket in the shoulder blade to form the shoulder joint. There is a firm rim of cartilage (labrum) around the edge of the socket. The labrum helps to deepen the socket and hold the humerus in place. If a certain part of the labrum becomes frayed or torn, it is called a SLAP lesion. A SLAP lesion can cause shoulder pain, instability, and weakness. SLAP lesions are common among athletes who play sports that involve repeated overhead movements. SLAP lesions may include a tear in the cord of tissue that attaches the muscle in the front of the upper arm to the shoulder blade (proximal biceps tendon). What are the causes? This condition may be caused by:  A sudden (acute) injury, which can result from:  Falling on an outstretched arm.  Movement of the shoulder joint out of its normal place (dislocation).  A direct hit to the shoulder.  Wear and tear over time, which can result from doing activities or sports that involve overhead arm movements. What increases the risk? The following factors may make you more likely to develop a SLAP lesion:  Having had a dislocated shoulder in the past.  Being age 17 or older.  Playing certain sports, such as:  Sports that involve repeated overhead movements, such as baseball or volleyball.  Sports that put backward pressure on the arms when the arms are overhead, such as gymnastics or basketball.  Contact sports.  Lifting weights. What are the signs or symptoms? The main symptom of this condition is shoulder pain that gets worse when lifting a heavy object or raising the arm overhead. Other signs and symptoms may  include:  Feeling like your shoulder is locking, catching, grinding, or popping.  Loss of strength.  Stiffness and limited range of motion.  Loss of throwing power ("dead arm"). How is this diagnosed? This condition may be diagnosed based on:  Your symptoms.  Your medical history.  A physical exam.  Imaging tests, such as MRIs. How is this treated? Treatment for this condition may include:  Resting your shoulder by avoiding activities that cause shoulder pain.  NSAIDs to help reduce pain and swelling.  Physical therapy to improve strength and range of motion.  Surgery. This may be done if other treatment methods do not help. Surgery may involve:  Removing frayed pieces of the labrum.  Repairing tears.  Reattaching the labrum.  Repairing the biceps tendon. Follow these instructions at home: Managing pain, stiffness, and swelling   If directed, put ice on the injured area.  Put ice in a plastic bag.  Place a towel between your skin and the bag.  Leave the ice on for 20 minutes, 2-3 times a day. Driving   Do not drive or operate heavy machinery while taking prescription pain medicine.  Ask your health care provider when it is safe for you to drive. Activity   Return to your normal activities as told by your health care provider. Ask your health care provider what activities are safe for you.  Do exercises as told by your health care provider. General instructions    Do not use any tobacco products, such  as cigarettes, chewing tobacco, or e-cigarettes. Tobacco can delay bone healing. If you need help quitting, ask your health care provider.  Take over-the-counter and prescription medicines only as told by your health care provider.  Keep all follow-up visits as told by your health care provider. This is important. How is this prevented?  Be safe and responsible while being active to avoid falls.  Maintain physical fitness, including strength and  flexibility. Contact a health care provider if:  Your symptoms have not improved after 6 months of treatment.  Your symptoms get worse instead of getting better. This information is not intended to replace advice given to you by your health care provider. Make sure you discuss any questions you have with your health care provider. Document Released: 08/12/2005 Document Revised: 04/18/2016 Document Reviewed: 07/15/2015 Elsevier Interactive Patient Education  2017 [employer].

## 2017-01-06 ENCOUNTER — Encounter: Payer: Self-pay | Admitting: Osteopathic Medicine

## 2017-01-06 DIAGNOSIS — M7501 Adhesive capsulitis of right shoulder: Secondary | ICD-10-CM | POA: Insufficient documentation

## 2017-03-04 ENCOUNTER — Ambulatory Visit (INDEPENDENT_AMBULATORY_CARE_PROVIDER_SITE_OTHER): Payer: 59 | Admitting: Rehabilitative and Restorative Service Providers"

## 2017-03-04 ENCOUNTER — Encounter: Payer: Self-pay | Admitting: Rehabilitative and Restorative Service Providers"

## 2017-03-04 DIAGNOSIS — M25511 Pain in right shoulder: Secondary | ICD-10-CM | POA: Diagnosis not present

## 2017-03-04 DIAGNOSIS — R29898 Other symptoms and signs involving the musculoskeletal system: Secondary | ICD-10-CM

## 2017-03-04 DIAGNOSIS — G8929 Other chronic pain: Secondary | ICD-10-CM

## 2017-03-04 DIAGNOSIS — M6281 Muscle weakness (generalized): Secondary | ICD-10-CM | POA: Diagnosis not present

## 2017-03-04 NOTE — Therapy (Signed)
[facility] [place] [address] [place] [address] [place], [place], [address] Phone: [phone]   Fax:  [phone]  Physical Therapy Evaluation  Patient Details  Name: Alexis Yu MRN: [id number] Date of Birth: 05/22/1966 Referring Provider: Dr Teryl Lucy  Encounter Date: 03/04/2017      PT End of Session - 03/04/17 1144    Visit Number 1   Number of Visits 12   Date for PT Re-Evaluation 04/15/17   PT Start Time 1015   PT Stop Time 1114   PT Time Calculation (min) 59 min   Activity Tolerance Patient tolerated treatment well      Past Medical History:  Diagnosis Date  . Antiphospholipid antibody positive   . Asthma   . Pneumonia   . Uterine hyperplasia     Past Surgical History:  Procedure Laterality Date  . CESAREAN SECTION  1993    There were no vitals filed for this visit.       Subjective Assessment - 03/04/17 1029    Subjective Patient reports that she was seen in PT 2/18 to 3/18 with no improvement in Rt shoulder pain. Md stopped all activity and symtpoms continued to worsen. She underwent elective surgery 01/23/17 for Rt shoulder arthroscopy with lysis and resection of adhesions/debridement with manipulation. Patient reports no post op complications. She has been stretching Rt UE.    Pertinent History dx - autoimune disease   Diagnostic tests MRi; xrays    Patient Stated Goals get arm moving again' put hair up independently    Currently in Pain? Yes   Pain Score 2    Pain Location Shoulder   Pain Orientation Right   Pain Type Chronic pain   Pain Radiating Towards into Rt side of neck and into the Rt UE    Pain Onset More than a month ago   Pain Frequency Intermittent   Aggravating Factors  moving; exercises; functional activities; working at computer    Pain Relieving Factors antiinflammatory; heat; ice             [facility] PT Assessment - 03/04/17 0001      Assessment   Medical Diagnosis Rt shoudler  dysfunction   Referring Provider Dr Teryl Lucy   Onset Date/Surgical Date 01/23/17  pain for the past 6 months    Hand Dominance Right   Next MD Visit 03/06/17   Prior Therapy yes      Precautions   Precautions None     Balance Screen   Has the patient fallen in the past 6 months No   Has the patient had a decrease in activity level because of a fear of falling?  No   Is the patient reluctant to leave their home because of a fear of falling?  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; now back at work light duty - [employer]    Leisure household chores; gardening     Observation/Other Assessments   Focus on Therapeutic Outcomes (FOTO)  53% limitation      Sensation   Additional Comments WFL's per pt report      Posture/Postural Control   Posture/Postural Control --  scapular dyskinesis Rt > Lt    Posture Comments significant head forward posture and alignment; scapulae abducted and rotated along the thoracic wall; head of the humerus anterior in orientation      AROM   Right/Left Shoulder --  assessed in standing    Right Shoulder Extension 37 Degrees   Right Shoulder Flexion 73 Degrees  discomfort and "stuck"   Right Shoulder ABduction 68 Degrees   Right Shoulder Internal Rotation 5 Degrees  UE in ~ 60 deg abd supported by PT-function to mid buttocks   Right Shoulder External Rotation 11 Degrees  UE in ~60 deg abd supported by PT    Left Shoulder Extension 56 Degrees   Left Shoulder Flexion 164 Degrees   Left Shoulder ABduction 165 Degrees   Left Shoulder Internal Rotation 36 Degrees   Left Shoulder External Rotation 74 Degrees   Cervical Flexion 59   Cervical Extension 50   Cervical - Right Side Bend 40   Cervical - Left Side Bend 34   Cervical - Right Rotation 74   Cervical - Left Rotation 76     Strength   Overall Strength Comments not tested due to surgery moves Rt  UE against gravity in all planes      Palpation   Spinal mobility hypomobile thoracic and cervical    Palpation comment significant tightness through pecs; upper trap; leveator; teres; lats; biceps; deltoid             Objective measurements completed on examination: See above findings.          [facility] Adult PT Treatment/Exercise - 03/04/17 0001      Neuro Re-ed    Neuro Re-ed Details  postural correction focus on engaging posterior shoulder girdle      Shoulder Exercises: Seated   Other Seated Exercises scapular depression closed chain pressing down 5 sec x 10      Shoulder Exercises: Standing   Other Standing Exercises scap squeeze 10 sec x 10 with noodle      Shoulder Exercises: Pulleys   Flexion --  5 reps 10 sec hold assist for position   ABduction --  5 reps 10 sec hold - assist for position of shd girdle      Vasopneumatic   Number Minutes Vasopneumatic  15 minutes   Vasopnuematic Location  Shoulder   Vasopneumatic Pressure Low   Vasopneumatic Temperature  34 deg                PT Education - 03/04/17 1125    Education provided Yes   Education Details HEP postural correction    Person(s) Educated Patient   Methods Explanation;Demonstration;Tactile cues;Verbal cues;Handout   Comprehension Verbalized understanding;Returned demonstration;Verbal cues required;Tactile cues required             PT Long Term Goals - 03/04/17 1150      PT LONG TERM GOAL #1   Title inprove posture and alignment with engagement of posterior shoulder girdle musculature 04/15/17   Time 6   Period Weeks   Status New     PT LONG TERM GOAL #2   Title patient reports ability to sleep for 2-3 hours without awakening due to pain 04/15/17   Time 6   Period Weeks   Status New     PT LONG TERM GOAL #3   Title Increase strength and function Rt UE allowing patient to put her hair into a pony tail 04/15/17   Time 6   Period Weeks   Status New     PT LONG TERM GOAL #4    Title Independent in HEP 04/15/17   Time 6   Period Weeks   Status New     PT LONG TERM GOAL #5  Title Improve FOTO to </= 36% limitation 04/15/17   Time 6   Period Weeks   Status New                Plan - 03/04/17 1145    Clinical Impression Statement Maralyn SagoSarah presents with Rt shoulder dysfunction. She is s/p Rt shoulder scopefor debridement and manipulation 01/23/17 with diagnosis including labral tear, partial RC tear, adhesive capsulitis. She has a history of Rt shoudler pain for several months prior to surgery.    History and Personal Factors relevant to plan of care: Lt partial tear RC ~ 10 yrs ago treated non surgically    Clinical Presentation Evolving   Clinical Decision Making Low   Rehab Potential Good   PT Frequency 2x / week   PT Duration 6 weeks   PT Treatment/Interventions Patient/family education;ADLs/Self Care Home Management;Neuromuscular re-education;Cryotherapy;Electrical Stimulation;Iontophoresis 4mg /ml Dexamethasone;Moist Heat;Ultrasound;Dry needling;Manual techniques;Therapeutic activities;Therapeutic exercise   PT Next Visit Plan review exercise; postural correction; progress with stretching and postural strengthening; manual work; modalities as indicated   Financial plannerConsulted and Agree with Plan of Care Patient      Patient will benefit from skilled therapeutic intervention in order to improve the following deficits and impairments:  Postural dysfunction, Improper body mechanics, Pain, Decreased range of motion, Increased fascial restricitons, Increased muscle spasms, Decreased strength, Impaired UE functional use, Decreased activity tolerance  Visit Diagnosis: Chronic right shoulder pain - Plan: PT plan of care cert/re-cert  Muscle weakness (generalized) - Plan: PT plan of care cert/re-cert  Other symptoms and signs involving the musculoskeletal system - Plan: PT plan of care cert/re-cert     Problem List Patient Active Problem List   Diagnosis Date  Noted  . Adhesive capsulitis of right shoulder 01/06/2017  . SLAP (superior labrum from anterior to posterior) tear 11/27/2016  . Rotator cuff tear 11/27/2016  . Right shoulder pain 10/02/2016  . Mild intermittent asthma with acute exacerbation 06/05/2016  . Lichenoid dermatitis 12/20/2015  . Perimenopause 09/27/2015  . Need for diphtheria-tetanus-pertussis (Tdap) vaccine, adult/adolescent 09/27/2015  . Medication management 09/27/2015  . Pelvic pain 01/06/2013  . Constipation, chronic 01/06/2013  . ASTHMA, NOS 04/18/2008  . OTHER AND UNSPECIFIED COAGULATION DEFECTS 02/24/2008  . ALLERGIC RHINITIS 02/24/2008  . HEADACHE, CHRONIC 02/24/2008  . PALPITATIONS, HX OF 02/23/2008    Mirjana Tarleton Rober MinionP Keymiah Lyles PT, MPH  03/04/2017, 1:07 PM  [facility] [address] [place], [place], [address] Phone: [phone]   Fax:  [phone]  Name: Evelena AsaSarah Trant MRN: [id number] Date of Birth: 07/30/1966

## 2017-03-04 NOTE — Patient Instructions (Addendum)
Axial Extension (Chin Tuck)    Pull chin in and lengthen back of neck. Hold __10__ seconds while counting out loud. Repeat __5__ times. Do __several__ sessions per day.  Shoulder Blade Squeeze   Noodle between shoulder blades  Rotate shoulders back, then squeeze shoulder blades down and back Hold 10 sec Repeat _10___ times. Do _several___ sessions per day.   External Rotator Cuff Stretch, Supine (Passive)   Standing, one elbow against ribs, and bent at 90, dowel in palm, other hand holding dowel up. Use other arm to push forearm toward floor. Keep elbow against side. Hold _10__ seconds. Repeat _10__ times per session. Do _2-3__ sessions per day.  Pulleys  Sitting pull right arm up using left arm upwards then repeat with arm slightly to side  Hold 10 sec repeat 10 reps   Sitting press down 5 sec hold x 10 reps  2-3 times/day

## 2017-03-11 ENCOUNTER — Ambulatory Visit (INDEPENDENT_AMBULATORY_CARE_PROVIDER_SITE_OTHER): Payer: 59 | Admitting: Physical Therapy

## 2017-03-11 DIAGNOSIS — M25511 Pain in right shoulder: Secondary | ICD-10-CM | POA: Diagnosis not present

## 2017-03-11 DIAGNOSIS — M6281 Muscle weakness (generalized): Secondary | ICD-10-CM

## 2017-03-11 DIAGNOSIS — R29898 Other symptoms and signs involving the musculoskeletal system: Secondary | ICD-10-CM | POA: Diagnosis not present

## 2017-03-11 DIAGNOSIS — G8929 Other chronic pain: Secondary | ICD-10-CM | POA: Diagnosis not present

## 2017-03-11 NOTE — Patient Instructions (Signed)
Axial Extension (Chin Tuck)    Pull chin in and lengthen back of neck. Hold __10__ seconds while counting out loud. Repeat __5__ times. Do __several__ sessions per day.  Shoulder Blade Squeeze   Noodle between shoulder blades  Rotate shoulders back, then squeeze shoulder blades down and back Hold 10 sec Repeat _10___ times. Do _several___ sessions per day.   External Rotator Cuff Stretch, Supine (Passive)   Standing, one elbow against ribs, and bent at 90, dowel in palm, other hand holding dowel up. Use other arm to push forearm toward floor. Keep elbow against side. Hold _10__ seconds. Repeat _10__ times per session. Do _2-3__ sessions per day.  Cane Exercise: Flexion    Lie on back, holding cane above chest. Keeping arms as straight as possible, lower cane toward floor beyond head. Hold __5__ seconds. Repeat __5-10__ times. Do _1-2___ sessions per day.  Pulleys  Sitting pull right arm up using left arm upwards then repeat with arm slightly to side  Hold 10 sec repeat 10 reps   Sitting press down 5 sec hold x 10 reps  2-3 times/day   [facility] [address] [place] [address] [place], [place] [address]  [phone] (office) [phone] (fax)

## 2017-03-11 NOTE — Therapy (Addendum)
[facility] [place] [address] [place] [address] [place], [place], [address] Phone: [phone]   Fax:  [phone]  Physical Therapy Treatment  Patient Details  Name: Alexis Yu MRN: [id number] Date of Birth: 07/21/1966 Referring Provider: Dr. Teryl Lucy  Encounter Date: 03/11/2017      PT End of Session - 03/11/17 0957    Visit Number 2   Number of Visits 12   Date for PT Re-Evaluation 04/15/17   PT Start Time 0850   PT Stop Time 0944   PT Time Calculation (min) 54 min   Activity Tolerance Patient tolerated treatment well      Past Medical History:  Diagnosis Date  . Antiphospholipid antibody positive   . Asthma   . Pneumonia   . Uterine hyperplasia     Past Surgical History:  Procedure Laterality Date  . CESAREAN SECTION  1993    There were no vitals filed for this visit.      Subjective Assessment - 03/11/17 0853    Subjective Patient reports that she jerked on her shoulder with the garden hose about a week ago, and this has set her back a bit. Alexis Yu has found out she has an autoimmune disease, and states that frozen shoulder is a result of stress and suppressed autoimmunity.   Pertinent History dx - autoimune disease   Diagnostic tests MRi; xrays    Patient Stated Goals get arm moving again' put hair up independently    Currently in Pain? Yes   Pain Score 2    Pain Location Shoulder   Pain Orientation Right   Pain Descriptors / Indicators Spasm   Pain Onset More than a month ago   Aggravating Factors  moving; exercises   Pain Relieving Factors antiinflammatory; heat; ice            [facility] PT Assessment - 03/11/17 0001      Assessment   Medical Diagnosis Rt shoulder dysfunction   Referring Provider Dr. Teryl Lucy   Onset Date/Surgical Date 01/23/17  pain for the past 6 months    Hand Dominance Right   Prior Therapy yes      ROM / Strength   AROM / PROM / Strength AROM;PROM     AROM   Right Shoulder  Extension 36 Degrees   Right Shoulder Flexion 75 Degrees   Right Shoulder ABduction 63 Degrees   Right Shoulder Internal Rotation 45 Degrees  supine, abducted 45 degrees   Right Shoulder External Rotation 7 Degrees     PROM   PROM Assessment Site Shoulder   Right/Left Shoulder Right   Right Shoulder External Rotation 19 Degrees  Supine, with cane           [facility] Adult PT Treatment/Exercise - 03/11/17 0001      Shoulder Exercises: Supine   External Rotation AAROM;10 reps;Right  With cane   Flexion AAROM;5 reps  cane, up to 115 deg with discomfort     Shoulder Exercises: Seated   Other Seated Exercises scapular depression closed chain pressing down 5 sec x 10      Shoulder Exercises: Standing   Other Standing Exercises scap squeeze 10 sec x 10 with noodle      Shoulder Exercises: Pulleys   Flexion --  8 reps 10 sec hold assist for position   ABduction --  8 reps 10 sec hold - assist for position of shd girdle      Modalities   Modalities Electrical Stimulation;Vasopneumatic  Programme researcher, broadcasting/film/video Location Rt. Shoulder   Electrical Stimulation Action IFC   Electrical Stimulation Parameters As tolerated   Electrical Stimulation Goals Pain     Vasopneumatic   Number Minutes Vasopneumatic  15 minutes   Vasopnuematic Location  Shoulder   Vasopneumatic Pressure Low   Vasopneumatic Temperature  34 deg                PT Education - 03/11/17 0932    Education provided Yes   Education Details HEP - reprinted.    Person(s) Educated Patient   Methods Handout;Verbal cues;Demonstration;Explanation   Comprehension Returned demonstration;Verbalized understanding             PT Long Term Goals - 03/04/17 1150      PT LONG TERM GOAL #1   Title inprove posture and alignment with engagement of posterior shoulder girdle musculature 04/15/17   Time 6   Period Weeks   Status New     PT LONG TERM GOAL #2   Title patient reports  ability to sleep for 2-3 hours without awakening due to pain 04/15/17   Time 6   Period Weeks   Status New     PT LONG TERM GOAL #3   Title Increase strength and function Rt UE allowing patient to put her hair into a pony tail 04/15/17   Time 6   Period Weeks   Status New     PT LONG TERM GOAL #4   Title Independent in HEP 04/15/17   Time 6   Period Weeks   Status New     PT LONG TERM GOAL #5   Title Improve FOTO to </= 36% limitation 04/15/17   Time 6   Period Weeks   Status New               Plan - 03/11/17 1610    Clinical Impression Statement Reviewed exercises from last treatment and measured Rt shoulder ROM; measurements similar to last assessment. Alexis Yu is still very limited with her Rt shoulder motion and limited by pain. Pt will benefit from further PT intervention to maximize functional mobility.    Rehab Potential Good   PT Frequency 2x / week   PT Duration 6 weeks   PT Treatment/Interventions Patient/family education;ADLs/Self Care Home Management;Neuromuscular re-education;Cryotherapy;Electrical Stimulation;Iontophoresis 4mg /ml Dexamethasone;Moist Heat;Ultrasound;Dry needling;Manual techniques;Therapeutic activities;Therapeutic exercise   PT Next Visit Plan Progress with stretching and postural strengthening exercises; manual work; modalities as indicated.   Consulted and Agree with Plan of Care Patient      Patient will benefit from skilled therapeutic intervention in order to improve the following deficits and impairments:  Postural dysfunction, Improper body mechanics, Pain, Decreased range of motion, Increased fascial restricitons, Increased muscle spasms, Decreased strength, Impaired UE functional use, Decreased activity tolerance  Visit Diagnosis: Chronic right shoulder pain  Muscle weakness (generalized)  Other symptoms and signs involving the musculoskeletal system  Acute pain of right shoulder     Problem List Patient Active Problem List    Diagnosis Date Noted  . Adhesive capsulitis of right shoulder 01/06/2017  . SLAP (superior labrum from anterior to posterior) tear 11/27/2016  . Rotator cuff tear 11/27/2016  . Right shoulder pain 10/02/2016  . Mild intermittent asthma with acute exacerbation 06/05/2016  . Lichenoid dermatitis 12/20/2015  . Perimenopause 09/27/2015  . Need for diphtheria-tetanus-pertussis (Tdap) vaccine, adult/adolescent 09/27/2015  . Medication management 09/27/2015  . Pelvic pain 01/06/2013  . Constipation, chronic 01/06/2013  . ASTHMA, NOS 04/18/2008  .  OTHER AND UNSPECIFIED COAGULATION DEFECTS 02/24/2008  . ALLERGIC RHINITIS 02/24/2008  . HEADACHE, CHRONIC 02/24/2008  . PALPITATIONS, HX OF 02/23/2008    Kipp LaurenceKatherine Tico Yu, SPTA 03/11/2017, 11:20 AM  During this treatment session, the therapist was present, participating in and directing the treatment. Mayer CamelJennifer Yu, PTA 03/11/17 11:29 AM   [facility] [address] [place] [address] [place], [place], [address] Phone: [phone]   Fax:  [phone]  Name: Alexis Yu MRN: [id number] Date of Birth: 05/17/1966

## 2017-03-13 ENCOUNTER — Ambulatory Visit (INDEPENDENT_AMBULATORY_CARE_PROVIDER_SITE_OTHER): Payer: 59 | Admitting: Physical Therapy

## 2017-03-13 DIAGNOSIS — M25511 Pain in right shoulder: Secondary | ICD-10-CM

## 2017-03-13 DIAGNOSIS — M6281 Muscle weakness (generalized): Secondary | ICD-10-CM

## 2017-03-13 DIAGNOSIS — R29898 Other symptoms and signs involving the musculoskeletal system: Secondary | ICD-10-CM | POA: Diagnosis not present

## 2017-03-13 DIAGNOSIS — G8929 Other chronic pain: Secondary | ICD-10-CM

## 2017-03-13 NOTE — Therapy (Addendum)
[facility] [address] [place] [address] [place], [place], [address] Phone: [phone]   Fax:  [phone]  Physical Therapy Treatment  Patient Details  Name: Alexis AsaSarah Parada MRN: [id number] Date of Birth: 08/12/1966 Referring Provider: Dr. Teryl LucyJoshua Landau  Encounter Date: 03/13/2017      PT End of Session - 03/13/17 1535    Visit Number 3   Number of Visits 12   Date for PT Re-Evaluation 04/15/17   PT Start Time 1532   PT Stop Time 1640   PT Time Calculation (min) 68 min   Activity Tolerance Patient tolerated treatment well      Past Medical History:  Diagnosis Date  . Antiphospholipid antibody positive   . Asthma   . Pneumonia   . Uterine hyperplasia     Past Surgical History:  Procedure Laterality Date  . CESAREAN SECTION  1993    There were no vitals filed for this visit.      Subjective Assessment - 03/13/17 1535    Subjective Patient has been doing exercises at home and states she is stiff today from doing exercises last night. Pt is not feeling pain, but is sore. Pt stated that she tried wall push-ups and modified planks yesterday; thinks that is why she is sore today.    Pertinent History dx - autoimune disease   Diagnostic tests MRi; xrays    Patient Stated Goals get arm moving again' put hair up independently    Currently in Pain? No/denies            [facility] - 03/13/17 0001      Assessment   Medical Diagnosis Rt shoulder dysfunction   Referring Provider Dr. Teryl LucyJoshua Landau   Onset Date/Surgical Date 01/23/17  pain for the past 6 months    Hand Dominance Right   Prior Therapy yes           [facility] Adult PT Treatment/Exercise - 03/13/17 0001      Shoulder Exercises: Supine   External Rotation AAROM;10 reps;Right  With cane   Flexion AAROM;10 reps  With cane     Shoulder Exercises: Standing   Other Standing Exercises scap squeeze 10 sec x 10 with noodle    Other Standing Exercises Reverse  wall-push-up x10; Rt shoulder rows x10  Mirror for visual feedback for rows     Shoulder Exercises: Pulleys   Flexion --  5 reps, 10 sec hold   ABduction --  5 reps, 10 sec hold     Modalities   Modalities Electrical Stimulation;Vasopneumatic     Electrical Stimulation   Electrical Stimulation Location Rt. Shoulder   Electrical Stimulation Action IFC   Electrical Stimulation Parameters As tolerated   Electrical Stimulation Goals Pain     Vasopneumatic   Number Minutes Vasopneumatic  15 minutes   Vasopnuematic Location  Shoulder   Vasopneumatic Pressure Low   Vasopneumatic Temperature  34 deg     Manual Therapy   Manual Therapy Passive ROM  Rt shoulder flexion     self care: Pt instructed in massage with ball to serratus area, and shown the passive table flexion stretch.  Pt verbalized understanding.         PT Long Term Goals - 03/13/17 1654      PT LONG TERM GOAL #1   Title inprove posture and alignment with engagement of posterior shoulder girdle musculature 04/15/17   Time 6   Period Weeks   Status On-going  PT LONG TERM GOAL #2   Title patient reports ability to sleep for 2-3 hours without awakening due to pain 04/15/17   Time 6   Period Weeks   Status On-going     PT LONG TERM GOAL #3   Title Increase strength and function Rt UE allowing patient to put her hair into a pony tail 04/15/17   Time 6   Period Weeks   Status On-going     PT LONG TERM GOAL #4   Title Independent in HEP 04/15/17   Time 6   Period Weeks   Status On-going     PT LONG TERM GOAL #5   Title Improve FOTO to </= 36% limitation 04/15/17   Time 6   Period Weeks   Status On-going               Plan - 03/13/17 1634    Clinical Impression Statement Pt mentioned that she is doing some shoulder strengthening exercises at home. She has been advised to discontinue strength exercises and to focus on performing stretching and range of motion exercises, as given for HEP. Pt  tolerated treatment well, but is still very limited in her Rt shoulder motion, due to guarding and pain . Pt will benefit from continued PT intervention to maximize functional mobility.   Rehab Potential Good   PT Frequency 2x / week   PT Duration 6 weeks   PT Treatment/Interventions Patient/family education;ADLs/Self Care Home Management;Neuromuscular re-education;Cryotherapy;Electrical Stimulation;Iontophoresis 4mg /ml Dexamethasone;Moist Heat;Ultrasound;Dry needling;Manual techniques;Therapeutic activities;Therapeutic exercise   PT Next Visit Plan Progress with stretching and postural strengthening exercises; manual work; modalities as indicated.   Consulted and Agree with Plan of Care Patient      Patient will benefit from skilled therapeutic intervention in order to improve the following deficits and impairments:  Postural dysfunction, Improper body mechanics, Pain, Decreased range of motion, Increased fascial restricitons, Increased muscle spasms, Decreased strength, Impaired UE functional use, Decreased activity tolerance  Visit Diagnosis: Chronic right shoulder pain  Muscle weakness (generalized)  Other symptoms and signs involving the musculoskeletal system  Acute pain of right shoulder     Problem List Patient Active Problem List   Diagnosis Date Noted  . Adhesive capsulitis of right shoulder 01/06/2017  . SLAP (superior labrum from anterior to posterior) tear 11/27/2016  . Rotator cuff tear 11/27/2016  . Right shoulder pain 10/02/2016  . Mild intermittent asthma with acute exacerbation 06/05/2016  . Lichenoid dermatitis 12/20/2015  . Perimenopause 09/27/2015  . Need for diphtheria-tetanus-pertussis (Tdap) vaccine, adult/adolescent 09/27/2015  . Medication management 09/27/2015  . Pelvic pain 01/06/2013  . Constipation, chronic 01/06/2013  . ASTHMA, NOS 04/18/2008  . OTHER AND UNSPECIFIED COAGULATION DEFECTS 02/24/2008  . ALLERGIC RHINITIS 02/24/2008  . HEADACHE,  CHRONIC 02/24/2008  . PALPITATIONS, HX OF 02/23/2008    Kipp Laurence, SPTA 03/13/2017, 5:00 PM  [facility] [address] [place] [address] [place], [place], [address] Phone: [phone]   Fax:  [phone]  Name: Krisi Azua MRN: [id number] Date of Birth: 09-30-1965

## 2017-03-17 ENCOUNTER — Ambulatory Visit (INDEPENDENT_AMBULATORY_CARE_PROVIDER_SITE_OTHER): Payer: 59 | Admitting: Physical Therapy

## 2017-03-17 DIAGNOSIS — R29898 Other symptoms and signs involving the musculoskeletal system: Secondary | ICD-10-CM

## 2017-03-17 DIAGNOSIS — G8929 Other chronic pain: Secondary | ICD-10-CM | POA: Diagnosis not present

## 2017-03-17 DIAGNOSIS — M6281 Muscle weakness (generalized): Secondary | ICD-10-CM

## 2017-03-17 DIAGNOSIS — M25511 Pain in right shoulder: Secondary | ICD-10-CM

## 2017-03-17 NOTE — Therapy (Addendum)
[facility] [address] [place], [place], [address] Phone: [phone]   Fax:  [phone]  Physical Therapy Treatment  Patient Details  Name: Alexis Yu MRN: [id number] Date of Birth: 11/25/1965 Referring Provider: Dr. Teryl LucyJoshua Landau  Encounter Date: 03/17/2017      PT End of Session - 03/17/17 1526    Visit Number 4   Number of Visits 12   Date for PT Re-Evaluation 04/15/17   PT Start Time 1533   PT Stop Time 1625   PT Time Calculation (min) 52 min   Activity Tolerance Patient tolerated treatment well      Past Medical History:  Diagnosis Date  . Antiphospholipid antibody positive   . Asthma   . Pneumonia   . Uterine hyperplasia     Past Surgical History:  Procedure Laterality Date  . CESAREAN SECTION  1993    There were no vitals filed for this visit.      Subjective Assessment - 03/17/17 1528    Subjective Alexis SagoSarah is not feeling any pain in her Rt shoulder today. Alexis SagoSarah felt something "give" in shoulder over the weekend, and is hoping it is a good thing. She feels she is getting further with ROM, but then goes back to where she started.   Pertinent History dx - autoimune disease   Diagnostic tests MRi; xrays    Patient Stated Goals get arm moving again' put hair up independently    Currently in Pain? No/denies            [facility] - 03/17/17 0001      Assessment   Medical Diagnosis Rt shoulder dysfunction   Referring Provider Dr. Teryl LucyJoshua Landau   Onset Date/Surgical Date 01/23/17  pain for the past 6 months    Hand Dominance Right   Prior Therapy yes      AROM   Right Shoulder Extension 38 Degrees  Standing, AAROM with cane   Right Shoulder Flexion 102 Degrees  AAROM, supine with cane   Right Shoulder ABduction 68 Degrees  Supine, AAROM with cane   Right Shoulder External Rotation 15 Degrees  Supine, AAROM with cane                     [facility] Adult PT  Treatment/Exercise - 03/17/17 0001      Shoulder Exercises: Supine   External Rotation AAROM;10 reps;Right  With cane   Flexion AAROM;10 reps  Supine, with cane, trial of lat stretch with hand on wall.    ABduction AAROM;Right;5 reps  Supine, with cane     Shoulder Exercises: Standing   Internal Rotation AAROM;5 reps  Standing, lifting cane behind back   Extension AAROM;Right;10 reps  With cane behind back   Other Standing Exercises With strap behind back; IR; 15 sec hold x5     Modalities   Modalities Electrical Stimulation;Vasopneumatic     Electrical Stimulation   Electrical Stimulation Location Rt. Shoulder   Electrical Stimulation Action IFC   Electrical Stimulation Parameters As tolerated   Electrical Stimulation Goals Pain     Vasopneumatic   Number Minutes Vasopneumatic  15 minutes   Vasopnuematic Location  Shoulder   Vasopneumatic Pressure Low   Vasopneumatic Temperature  34 deg     Manual Therapy   Manual Therapy Passive ROM;Soft tissue mobilization     Manual therapy comments Passive ROM to Rt shoulder in ext (with elbow ext) flexion, ER, scaption.  - pt very guarded.  Soft tissue mobilization TPR in Rt subscap with contract/relax.                      PT Long Term Goals - 03/13/17 1654      PT LONG TERM GOAL #1   Title inprove posture and alignment with engagement of posterior shoulder girdle musculature 04/15/17   Time 6   Period Weeks   Status On-going     PT LONG TERM GOAL #2   Title patient reports ability to sleep for 2-3 hours without awakening due to pain 04/15/17   Time 6   Period Weeks   Status On-going     PT LONG TERM GOAL #3   Title Increase strength and function Rt UE allowing patient to put her hair into a pony tail 04/15/17   Time 6   Period Weeks   Status On-going     PT LONG TERM GOAL #4   Title Independent in HEP 04/15/17   Time 6   Period Weeks   Status On-going     PT LONG TERM GOAL #5   Title Improve FOTO  to </= 36% limitation 04/15/17   Time 6   Period Weeks   Status On-going               Plan - 03/17/17 1615    Clinical Impression Statement Alexis Yu showed slight increase in PROM of Rt shoulder. Pt is guarded and her Rt shoulder is tight, and she should continue to perform stretching and ROM exercises. Pt tolerated treatment well and will continue to benefit from PT treatments to increase functional mobility.      Patient will benefit from skilled therapeutic intervention in order to improve the following deficits and impairments:  Postural dysfunction, Improper body mechanics, Pain, Decreased range of motion, Increased fascial restricitons, Increased muscle spasms, Decreased strength, Impaired UE functional use, Decreased activity tolerance  Visit Diagnosis: Chronic right shoulder pain  Muscle weakness (generalized)  Other symptoms and signs involving the musculoskeletal system  Acute pain of right shoulder     Problem List Patient Active Problem List   Diagnosis Date Noted  . Adhesive capsulitis of right shoulder 01/06/2017  . SLAP (superior labrum from anterior to posterior) tear 11/27/2016  . Rotator cuff tear 11/27/2016  . Right shoulder pain 10/02/2016  . Mild intermittent asthma with acute exacerbation 06/05/2016  . Lichenoid dermatitis 12/20/2015  . Perimenopause 09/27/2015  . Need for diphtheria-tetanus-pertussis (Tdap) vaccine, adult/adolescent 09/27/2015  . Medication management 09/27/2015  . Pelvic pain 01/06/2013  . Constipation, chronic 01/06/2013  . ASTHMA, NOS 04/18/2008  . OTHER AND UNSPECIFIED COAGULATION DEFECTS 02/24/2008  . ALLERGIC RHINITIS 02/24/2008  . HEADACHE, CHRONIC 02/24/2008  . PALPITATIONS, HX OF 02/23/2008    Kipp Laurence, SPTA 03/17/2017, 4:42 PM  [facility] [address] [place] [address] [place], [place], [address] Phone: [phone]   Fax:  [phone]  Name: Alexis Yu MRN:  [id number] Date of Birth: August 18, 1966

## 2017-03-20 ENCOUNTER — Encounter: Payer: Self-pay | Admitting: Physical Therapy

## 2017-03-26 ENCOUNTER — Ambulatory Visit (INDEPENDENT_AMBULATORY_CARE_PROVIDER_SITE_OTHER): Payer: 59 | Admitting: Physical Therapy

## 2017-03-26 DIAGNOSIS — G8929 Other chronic pain: Secondary | ICD-10-CM

## 2017-03-26 DIAGNOSIS — M6281 Muscle weakness (generalized): Secondary | ICD-10-CM

## 2017-03-26 DIAGNOSIS — M25511 Pain in right shoulder: Secondary | ICD-10-CM | POA: Diagnosis not present

## 2017-03-26 DIAGNOSIS — R29898 Other symptoms and signs involving the musculoskeletal system: Secondary | ICD-10-CM

## 2017-03-26 NOTE — Therapy (Signed)
[facility] [address] [place], [place], [address] Phone: [phone]   Fax:  [phone]  Physical Therapy Treatment  Patient Details  Name: Alexis Yu MRN: [id number] Date of Birth: 03/22/1966 Referring Provider: Dr. Teryl LucyJoshua Landau  Encounter Date: 03/26/2017      PT End of Session - 03/26/17 1447    Visit Number 5   Number of Visits 12   Date for PT Re-Evaluation 04/15/17   PT Start Time 1403   PT Stop Time 1437   PT Time Calculation (min) 34 min      Past Medical History:  Diagnosis Date  . Antiphospholipid antibody positive   . Asthma   . Pneumonia   . Uterine hyperplasia     Past Surgical History:  Procedure Laterality Date  . CESAREAN SECTION  1993    There were no vitals filed for this visit.      Subjective Assessment - 03/26/17 1718    Subjective Pt reports she feels she can lift Rt shoulder higher than she did last session (demonstrates).  She continues to have shoulder pain only after she has done a good deal of stretching.  She has returned to therapy after being at beach for a week.    Pertinent History dx - autoimune disease   Patient Stated Goals get arm moving again' put hair up independently    Currently in Pain? No/denies   Pain Score 0-No pain            [facility] PT Assessment - 03/26/17 0001      Assessment   Medical Diagnosis Rt shoulder dysfunction   Referring Provider Dr. Teryl LucyJoshua Landau   Onset Date/Surgical Date 01/23/17  pain for the past 6 months    Hand Dominance Right   Prior Therapy yes      AROM   Right Shoulder Flexion 120 Degrees  AAROM in supine, assist from LUE.   Right Shoulder External Rotation 28 Degrees  supine, scaption plane          [facility] Adult PT Treatment/Exercise - 03/26/17 0001      Shoulder Exercises: Supine   Flexion AAROM;Right;5 reps  LUE assist     Shoulder Exercises: Standing   Other Standing Exercises pendulum with Rt arm      Shoulder Exercises: Stretch   Table Stretch - Flexion 5 reps;10 seconds   Table Stretch -Flexion Limitations challenging, guarded.    Other Shoulder Stretches Rt shoulder sleeper stretch, 15 sec, 3 reps each way - to tolerance    Other Shoulder Stretches Rt shoulder low load, long duration - 1# in hand, moving into ER x 3 min      Modalities   Modalities Ultrasound     Ultrasound   Ultrasound Location Posterior Rt shoulder    Ultrasound Parameters 100%, 1.2w/cm2, 8 min    Ultrasound Goals Other (Comment)  tightness     Manual Therapy   Manual Therapy Soft tissue mobilization;Passive ROM   Manual therapy comments Edge tool assistance to posterior Rt shoulder (teres maj, rhomboid, infraspinatus, levator)    Passive ROM Rt shoulder flexion, scaption, ER                 PT Education - 03/26/17 1720    Education provided Yes   Education Details HEP - added sleeper stretch; low load long duration shoulder ER stretch to HEP     Person(s) Educated Patient   Methods Explanation   Comprehension Verbalized understanding  PT Long Term Goals - 03/13/17 1654      PT LONG TERM GOAL #1   Title inprove posture and alignment with engagement of posterior shoulder girdle musculature 04/15/17   Time 6   Period Weeks   Status On-going     PT LONG TERM GOAL #2   Title patient reports ability to sleep for 2-3 hours without awakening due to pain 04/15/17   Time 6   Period Weeks   Status On-going     PT LONG TERM GOAL #3   Title Increase strength and function Rt UE allowing patient to put her hair into a pony tail 04/15/17   Time 6   Period Weeks   Status On-going     PT LONG TERM GOAL #4   Title Independent in HEP 04/15/17   Time 6   Period Weeks   Status On-going     PT LONG TERM GOAL #5   Title Improve FOTO to </= 36% limitation 04/15/17   Time 6   Period Weeks   Status On-going             Patient will benefit from skilled therapeutic intervention  in order to improve the following deficits and impairments:     Visit Diagnosis: Chronic right shoulder pain  Muscle weakness (generalized)  Other symptoms and signs involving the musculoskeletal system     Problem List Patient Active Problem List   Diagnosis Date Noted  . Adhesive capsulitis of right shoulder 01/06/2017  . SLAP (superior labrum from anterior to posterior) tear 11/27/2016  . Rotator cuff tear 11/27/2016  . Right shoulder pain 10/02/2016  . Mild intermittent asthma with acute exacerbation 06/05/2016  . Lichenoid dermatitis 12/20/2015  . Perimenopause 09/27/2015  . Need for diphtheria-tetanus-pertussis (Tdap) vaccine, adult/adolescent 09/27/2015  . Medication management 09/27/2015  . Pelvic pain 01/06/2013  . Constipation, chronic 01/06/2013  . ASTHMA, NOS 04/18/2008  . OTHER AND UNSPECIFIED COAGULATION DEFECTS 02/24/2008  . ALLERGIC RHINITIS 02/24/2008  . HEADACHE, CHRONIC 02/24/2008  . PALPITATIONS, HX OF 02/23/2008   Mayer CamelJennifer Carlson-Long, PTA 03/26/17 5:27 PM  [facility] [address] [place] [address] [place], [place], [address] Phone: [phone]   Fax:  [phone]  Name: Alexis Yu MRN: [id number] Date of Birth: 09/22/1965

## 2017-03-28 ENCOUNTER — Encounter: Payer: Self-pay | Admitting: Physical Therapy

## 2017-03-28 ENCOUNTER — Ambulatory Visit (INDEPENDENT_AMBULATORY_CARE_PROVIDER_SITE_OTHER): Payer: 59 | Admitting: Physical Therapy

## 2017-03-28 DIAGNOSIS — R29898 Other symptoms and signs involving the musculoskeletal system: Secondary | ICD-10-CM | POA: Diagnosis not present

## 2017-03-28 DIAGNOSIS — M6281 Muscle weakness (generalized): Secondary | ICD-10-CM | POA: Diagnosis not present

## 2017-03-28 DIAGNOSIS — G8929 Other chronic pain: Secondary | ICD-10-CM

## 2017-03-28 DIAGNOSIS — M25511 Pain in right shoulder: Secondary | ICD-10-CM | POA: Diagnosis not present

## 2017-03-28 NOTE — Patient Instructions (Signed)
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
## 2017-04-03 ENCOUNTER — Encounter: Payer: Self-pay | Admitting: Physical Therapy

## 2017-04-03 ENCOUNTER — Ambulatory Visit (INDEPENDENT_AMBULATORY_CARE_PROVIDER_SITE_OTHER): Payer: 59 | Admitting: Physical Therapy

## 2017-04-03 DIAGNOSIS — R29898 Other symptoms and signs involving the musculoskeletal system: Secondary | ICD-10-CM

## 2017-04-03 DIAGNOSIS — M6281 Muscle weakness (generalized): Secondary | ICD-10-CM | POA: Diagnosis not present

## 2017-04-03 DIAGNOSIS — G8929 Other chronic pain: Secondary | ICD-10-CM

## 2017-04-03 DIAGNOSIS — M25511 Pain in right shoulder: Secondary | ICD-10-CM | POA: Diagnosis not present

## 2017-04-03 NOTE — Therapy (Signed)
[facility] [address] [place], [place], [address] Phone: [phone]   Fax:  [phone]  Physical Therapy Treatment  Patient Details  Name: Alexis Yu MRN: [id number] Date of Birth: 07/02/1966 Referring Provider: Dr. Teryl LucyJoshua Landau  Encounter Date: 04/03/2017      PT End of Session - 04/03/17 0935    Visit Number 7   Number of Visits 12   Date for PT Re-Evaluation 04/15/17   PT Start Time 0935   PT Stop Time 1029   PT Time Calculation (min) 54 min   Activity Tolerance Patient limited by pain      Past Medical History:  Diagnosis Date  . Antiphospholipid antibody positive   . Asthma   . Pneumonia   . Uterine hyperplasia     Past Surgical History:  Procedure Laterality Date  . CESAREAN SECTION  1993    There were no vitals filed for this visit.      Subjective Assessment - 04/03/17 0938    Subjective Alexis Yu reports she has had increased pain since her last visit, had a follow up with her MD and MD thinks that she probably is in an acute flare up of frozen shoulder.  The request is to teach her HEP to start now and then a progression she can continue with at home as she has a limited number of PT visits left and if the shoulder doesn't improve she will need a manipulation.     Patient Stated Goals get arm moving again' put hair up independently    Currently in Pain? Yes   Pain Score 5    Pain Location Shoulder   Pain Orientation Right   Pain Descriptors / Indicators Aching;Sharp;Shooting   Pain Type Chronic pain   Pain Onset More than a month ago   Pain Frequency Constant   Aggravating Factors  all movement of the Rt shoulder   Pain Relieving Factors has tried TENs and heat                         [facility] Adult PT Treatment/Exercise - 04/03/17 0001      Shoulder Exercises: Standing   Retraction 10 reps  10 sec holds     Shoulder Exercises: ROM/Strengthening   Other ROM/Strengthening  Exercises AAROM Rt shoulder chest press, flex, ER arm at side and abductd ~ 75 degrees.      Shoulder Exercises: Isometric Strengthening   External Rotation 5X10"   Internal Rotation 5X10"     Modalities   Modalities Electrical Stimulation;Vasopneumatic     Electrical Stimulation   Electrical Stimulation Location Rt. Shoulder   Electrical Stimulation Action IFC   Electrical Stimulation Parameters to tolerance   Electrical Stimulation Goals Pain;Tone     Vasopneumatic   Number Minutes Vasopneumatic  15 minutes   Vasopnuematic Location  Shoulder   Vasopneumatic Pressure Low   Vasopneumatic Temperature  3*                PT Education - 04/03/17 0953    Education provided Yes   Education Details revised HEP due to flare up after last visit and all the things happending at home.    Person(s) Educated Patient   Methods Explanation;Handout;Demonstration   Comprehension Returned demonstration;Verbalized understanding             PT Long Term Goals - 04/03/17 1019      PT LONG TERM GOAL #1   Title inprove  posture and alignment with engagement of posterior shoulder girdle musculature 04/15/17   Status On-going     PT LONG TERM GOAL #2   Title patient reports ability to sleep for 2-3 hours without awakening due to pain 04/15/17   Status On-going     PT LONG TERM GOAL #3   Title Increase strength and function Rt UE allowing patient to put her hair into a pony tail 04/15/17   Status On-going     PT LONG TERM GOAL #4   Title Independent in HEP 04/15/17   Status On-going     PT LONG TERM GOAL #5   Title Improve FOTO to </= 36% limitation 04/15/17   Status On-going               Plan - 04/03/17 1032    Clinical Impression Statement Pt with acute flare up of Rt shoulder pain after her last visit,  MD wants her to back down on the exercise and work on settling the shoulder back down.  She has decreased Rt shoulder ROM as compared to last week and significant  increase in pain.  She is also wishing to conserve her visits as she may need a manipulation and has a limited number of PT visit s with her insurance.    Rehab Potential Good   PT Frequency 1x / week   PT Treatment/Interventions Patient/family education;ADLs/Self Care Home Management;Neuromuscular re-education;Cryotherapy;Electrical Stimulation;Iontophoresis 4mg /ml Dexamethasone;Moist Heat;Ultrasound;Dry needling;Manual techniques;Therapeutic activities;Therapeutic exercise   PT Next Visit Plan teach HEP progression to patient and then either place on hold/discharge.    Consulted and Agree with Plan of Care Patient      Patient will benefit from skilled therapeutic intervention in order to improve the following deficits and impairments:  Postural dysfunction, Improper body mechanics, Pain, Decreased range of motion, Increased fascial restricitons, Increased muscle spasms, Decreased strength, Impaired UE functional use, Decreased activity tolerance  Visit Diagnosis: Other symptoms and signs involving the musculoskeletal system  Muscle weakness (generalized)  Chronic right shoulder pain  Acute pain of right shoulder     Problem List Patient Active Problem List   Diagnosis Date Noted  . Adhesive capsulitis of right shoulder 01/06/2017  . SLAP (superior labrum from anterior to posterior) tear 11/27/2016  . Rotator cuff tear 11/27/2016  . Right shoulder pain 10/02/2016  . Mild intermittent asthma with acute exacerbation 06/05/2016  . Lichenoid dermatitis 12/20/2015  . Perimenopause 09/27/2015  . Need for diphtheria-tetanus-pertussis (Tdap) vaccine, adult/adolescent 09/27/2015  . Medication management 09/27/2015  . Pelvic pain 01/06/2013  . Constipation, chronic 01/06/2013  . ASTHMA, NOS 04/18/2008  . OTHER AND UNSPECIFIED COAGULATION DEFECTS 02/24/2008  . ALLERGIC RHINITIS 02/24/2008  . HEADACHE, CHRONIC 02/24/2008  . PALPITATIONS, HX OF 02/23/2008    Roderic Scarce P  T 04/03/2017, 10:35 AM  [facility] [address] [place], [place], [address] Phone: [phone]   Fax:  [phone]  Name: Alexis Yu MRN: [id number] Date of Birth: 06-19-1966

## 2017-04-08 ENCOUNTER — Encounter: Payer: 59 | Admitting: Physical Therapy

## 2017-04-09 ENCOUNTER — Ambulatory Visit (INDEPENDENT_AMBULATORY_CARE_PROVIDER_SITE_OTHER): Payer: 59 | Admitting: Physical Therapy

## 2017-04-09 ENCOUNTER — Encounter: Payer: Self-pay | Admitting: Physical Therapy

## 2017-04-09 DIAGNOSIS — R29898 Other symptoms and signs involving the musculoskeletal system: Secondary | ICD-10-CM

## 2017-04-09 DIAGNOSIS — M6281 Muscle weakness (generalized): Secondary | ICD-10-CM

## 2017-04-09 DIAGNOSIS — G8929 Other chronic pain: Secondary | ICD-10-CM

## 2017-04-09 DIAGNOSIS — M25511 Pain in right shoulder: Secondary | ICD-10-CM | POA: Diagnosis not present

## 2017-04-09 NOTE — Patient Instructions (Addendum)
Strengthening: Isometric Internal Rotation    Keeping right elbow at side, use other hand at wrist to apply light resistance to inward motion. Hold _5___ seconds. Repeat __10__ times per set. Do _1___ sets per session. Do __1__ sessions per day.   Strengthening: Isometric External Rotation    Keeping left elbow at side, use other hand at wrist to apply light resistance to outward motion. Hold __5__ seconds. Repeat __10__ times per set. Do __1__ sets per session. Do ___1_ sessions per day.  Strengthening: Isometric Flexion    Using wall for resistance, press right fist into ball using light pressure. Hold __5__ seconds. Repeat __10__ times per set. Do __1__ sets per session. Do ___1_ sessions per day.   Strengthening: Isometric Abduction    Using wall for resistance, press left arm into ball using light pressure. Hold __5__ seconds. Repeat __10__ times per set. Do __1__ sets per session. Do _1___ sessions per day.  Strengthening: Isometric Extension    Using wall for resistance, press back of left arm into ball using light pressure. Hold _5___ seconds. Repeat __10__ times per set. Do __1__ sets per session. Do __1__ sessions per day.  Scapular Retraction (Standing)    With arms at sides, pinch shoulder blades together. Hold 5 sec. Repeat _10___ times per set. Do __1__ sets per session. Do __1__ sessions per day.   After about a week or two start these unless you are still having a lot of pain.  ~ 04/23/17, start with yellow band, progress to red.   Over Head Pull: Narrow Grip        On back, knees bent, feet flat, band across thighs, elbows straight but relaxed. Pull hands apart (start). Keeping elbows straight, bring arms up and over head, hands toward floor. Keep pull steady on band. Hold momentarily. Return slowly, keeping pull steady, back to start. Repeat _10-20__ times. Band color ______   Side Pull: Double Arm   On back, knees bent, feet flat. Arms  perpendicular to body, shoulder level, elbows straight but relaxed. Pull arms out to sides, elbows straight. Resistance band comes across collarbones, hands toward floor. Hold momentarily. Slowly return to starting position. Repeat _10-20__ times. Band color _____   Sash   On back, knees bent, feet flat, left hand on left hip, right hand above left. Pull right arm DIAGONALLY (hip to shoulder) across chest. Bring right arm along head toward floor. Hold momentarily. Slowly return to starting position. Repeat _10-20__ times. Do with left arm. Band color ______   Shoulder Rotation: Double Arm   On back, knees bent, feet flat, elbows tucked at sides, bent 90, hands palms up. Pull hands apart and down toward floor, keeping elbows near sides. Hold momentarily. Slowly return to starting position. Repeat 10-20___ times. Band color ______     IF still doing well after another week add in these.   Resisted External Rotation: in Neutral - Bilateral    Sit or stand, tubing in both hands, elbows at sides, bent to 90, forearms forward. Pinch shoulder blades together and rotate forearms out. Keep elbows at sides. Repeat __10-20__ times per set. Do __1__ sets per session. Do __1__ sessions per day.  Scapular Retraction: Rowing (Eccentric) - Arms - 45 Degrees (Resistance Band)    Hold end of band in each hand. Pull back until elbows are even with trunk. Keep elbows out from sides at 45, thumbs up. Slowly release for 3-5 seconds. Use ________ resistance band. 10-20__ reps per set, __1_  sets per day, _7__ days per week.  Resisted Horizontal Abduction: Bilateral    Sit or stand, tubing in both hands, arms out in front. Keeping arms straight, pinch shoulder blades together and stretch arms out. Repeat _10-20___ times per set. Do __1__ sets per session. Do ___1_ sessions per day.  Progressive Resisted: External Rotation (Side-Lying)    Holding _1-2 ( soup can) ___ pound weight, towel under arm,  raise right forearm toward ceiling. Keep elbow bent and at side. Repeat __10-20__ times per set. Do __1__ sets per session. Do ___1_ sessions per day.  Progressive Resisted: Internal Rotation (Side-Lying)    Holding _1-2 ( soup can)___ pound weight, raise right forearm toward body, keeping elbow at side. Repeat _10-20__ times per set. Do _1___ sets per session. Do __1__ sessions per day.

## 2017-04-09 NOTE — Therapy (Signed)
Alexis Yu, [place], [address] Phone: [phone]   Fax:  [phone]  Physical Therapy Treatment  Patient Details  Name: Alexis Yu MRN: [id number] Date of Birth: [birth year] Referring Provider: Dr. Marchia Bond  Encounter Date: 04/09/2017      PT End of Session - 04/09/17 1515    Visit Number 8   PT Start Time 9977   PT Stop Time 1528   PT Time Calculation (min) 46 min   Activity Tolerance Patient tolerated treatment well      Past Medical History:  Diagnosis Date  . Antiphospholipid antibody positive   . Asthma   . Pneumonia   . Uterine hyperplasia     Past Surgical History:  Procedure Laterality Date  . CESAREAN SECTION  1993    There were no vitals filed for this visit.      Subjective Assessment - 04/09/17 1444    Subjective Pt reports that the pain is starting to settle down, no more pain shooting down the front of her Rt arm, only into the deltoid.    Patient Stated Goals get arm moving again' put hair up independently    Currently in Pain? Yes   Pain Score 3    Pain Location Shoulder   Pain Orientation Right   Pain Descriptors / Indicators Aching   Pain Type Chronic pain   Pain Onset More than a month ago   Pain Frequency Constant   Aggravating Factors  all movement of shoulder   Pain Relieving Factors tens heat            [facility] PT Assessment - 04/09/17 0001      Assessment   Medical Diagnosis Rt shoulder dysfunction     Observation/Other Assessments   Focus on Therapeutic Outcomes (FOTO)  36% limited     AROM   Right Shoulder Extension 52 Degrees   Right Shoulder Flexion 75 Degrees  seated supine 114   Right Shoulder ABduction 64 Degrees  seated, supine 92   Right Shoulder Internal Rotation 55 Degrees   Right Shoulder External Rotation 30 Degrees                     [facility] Adult PT Treatment/Exercise - 04/09/17 0001      Self-Care    Self-Care Other Self-Care Comments;Heat/Ice Application   Other Self-Care Comments  reviewed exercise progression for her shoulder per handout in HEP. Pt voiced understanding.     Shoulder Exercises: Pulleys   Flexion 3 minutes     Shoulder Exercises: Isometric Strengthening   Flexion 5X10"   Extension 5X10"   External Rotation 5X10"   Internal Rotation 5X10"   ABduction 5X10"     Modalities   Modalities Electrical Stimulation;Vasopneumatic     Electrical Stimulation   Electrical Stimulation Location Rt. Shoulder   Electrical Stimulation Action IFC   Electrical Stimulation Parameters to tolerance   Electrical Stimulation Goals Pain;Tone     Vasopneumatic   Number Minutes Vasopneumatic  15 minutes   Vasopnuematic Location  Shoulder   Vasopneumatic Pressure Low   Vasopneumatic Temperature  3*                     PT Long Term Goals - 04/09/17 1518      PT LONG TERM GOAL #1   Title inprove posture and alignment with engagement of posterior shoulder girdle musculature 04/15/17   Status Achieved     PT  LONG TERM GOAL #2   Title patient reports ability to sleep for 2-3 hours without awakening due to pain 04/15/17   Status Not Met     PT LONG TERM GOAL #3   Title Increase strength and function Rt UE allowing patient to put her hair into a pony tail 04/15/17   Status Not Met     PT LONG TERM GOAL #4   Title Independent in HEP 04/15/17   Status Achieved     PT LONG TERM GOAL #5   Title Improve FOTO to </= 36% limitation 04/15/17   Status Achieved  scored 36 % limited               Plan - 04/09/17 1517    Clinical Impression Statement Alexis Yu has been instructed in exercise progression over the next month.  Her pain is beginning to settle down and her motion is almost back to what is was before this flare up.  She is requesting discharge to conserve her visits for the year.    PT Next Visit Plan discharge to HEP    Consulted and Agree with Plan of Care  Patient      Patient will benefit from skilled therapeutic intervention in order to improve the following deficits and impairments:     Visit Diagnosis: Muscle weakness (generalized)  Chronic right shoulder pain  Other symptoms and signs involving the musculoskeletal system     Problem List Patient Active Problem List   Diagnosis Date Noted  . Adhesive capsulitis of right shoulder 01/06/2017  . SLAP (superior labrum from anterior to posterior) tear 11/27/2016  . Rotator cuff tear 11/27/2016  . Right shoulder pain 10/02/2016  . Mild intermittent asthma with acute exacerbation 06/05/2016  . Lichenoid dermatitis 12/20/2015  . Perimenopause 09/27/2015  . Need for diphtheria-tetanus-pertussis (Tdap) vaccine, adult/adolescent 09/27/2015  . Medication management 09/27/2015  . Pelvic pain 01/06/2013  . Constipation, chronic 01/06/2013  . ASTHMA, NOS 04/18/2008  . OTHER AND UNSPECIFIED COAGULATION DEFECTS 02/24/2008  . ALLERGIC RHINITIS 02/24/2008  . HEADACHE, CHRONIC 02/24/2008  . PALPITATIONS, HX OF 02/23/2008    Alexis Yu,Alexis Yu 04/09/2017, 3:19 PM  Alexis Yu Alexis Yu Alexis Yu Alexis Yu [place], [place], [address] Phone: [phone]   Fax:  [phone]  Name: Alexis Yu MRN: [id number] Date of Birth: Jan 21, 1966   PHYSICAL THERAPY DISCHARGE SUMMARY  Visits from Start of Care: 8  Current functional level related to goals / functional outcomes: See above for ROM measurements.  Patient has been issued an HEP and is going to progress herself at home.  She is trying to conserve her PT visits through her insurance in case they are needed later this year.    Remaining deficits: Unable to functionally use her Rt UE at this time.    Education / Equipment: HEP Plan: Patient agrees to discharge.  Patient goals were partially met. Patient is being discharged due to financial reasons.  ?????     Alexis Yu, PT 04/09/17 3:21 PM

## 2017-04-11 ENCOUNTER — Encounter: Payer: 59 | Admitting: Physical Therapy

## 2017-04-14 ENCOUNTER — Encounter: Payer: 59 | Admitting: Physical Therapy

## 2017-09-09 ENCOUNTER — Other Ambulatory Visit: Payer: Self-pay | Admitting: Osteopathic Medicine

## 2017-09-09 DIAGNOSIS — J4521 Mild intermittent asthma with (acute) exacerbation: Secondary | ICD-10-CM

## 2017-09-09 DIAGNOSIS — Z8709 Personal history of other diseases of the respiratory system: Secondary | ICD-10-CM

## 2018-07-06 ENCOUNTER — Other Ambulatory Visit: Payer: Self-pay | Admitting: Osteopathic Medicine

## 2018-07-06 DIAGNOSIS — Z0000 Encounter for general adult medical examination without abnormal findings: Secondary | ICD-10-CM

## 2018-07-08 ENCOUNTER — Ambulatory Visit (INDEPENDENT_AMBULATORY_CARE_PROVIDER_SITE_OTHER): Payer: 59

## 2018-07-08 DIAGNOSIS — R928 Other abnormal and inconclusive findings on diagnostic imaging of breast: Secondary | ICD-10-CM

## 2018-07-08 DIAGNOSIS — Z0000 Encounter for general adult medical examination without abnormal findings: Secondary | ICD-10-CM

## 2018-07-10 ENCOUNTER — Other Ambulatory Visit: Payer: Self-pay | Admitting: Osteopathic Medicine

## 2018-07-10 DIAGNOSIS — R928 Other abnormal and inconclusive findings on diagnostic imaging of breast: Secondary | ICD-10-CM

## 2018-07-15 ENCOUNTER — Ambulatory Visit
Admission: RE | Admit: 2018-07-15 | Discharge: 2018-07-15 | Disposition: A | Discharge status: Home / Self Care | Payer: 59 | Source: Ambulatory Visit | Attending: Osteopathic Medicine | Admitting: Osteopathic Medicine

## 2018-07-15 DIAGNOSIS — R928 Other abnormal and inconclusive findings on diagnostic imaging of breast: Secondary | ICD-10-CM

## 2018-07-27 ENCOUNTER — Encounter: Payer: Self-pay | Admitting: Obstetrics & Gynecology

## 2018-07-27 ENCOUNTER — Ambulatory Visit (INDEPENDENT_AMBULATORY_CARE_PROVIDER_SITE_OTHER): Payer: 59 | Admitting: Obstetrics & Gynecology

## 2018-07-27 VITALS — BP 109/65 | HR 85 | Resp 16 | Ht 70.0 in | Wt 222.0 lb

## 2018-07-27 DIAGNOSIS — Z23 Encounter for immunization: Secondary | ICD-10-CM

## 2018-07-27 DIAGNOSIS — N898 Other specified noninflammatory disorders of vagina: Secondary | ICD-10-CM

## 2018-07-27 DIAGNOSIS — Z1151 Encounter for screening for human papillomavirus (HPV): Secondary | ICD-10-CM

## 2018-07-27 DIAGNOSIS — Z01419 Encounter for gynecological examination (general) (routine) without abnormal findings: Secondary | ICD-10-CM | POA: Diagnosis not present

## 2018-07-27 DIAGNOSIS — Z124 Encounter for screening for malignant neoplasm of cervix: Secondary | ICD-10-CM

## 2018-07-27 DIAGNOSIS — Z01411 Encounter for gynecological examination (general) (routine) with abnormal findings: Secondary | ICD-10-CM

## 2018-07-27 NOTE — Progress Notes (Signed)
Subjective:     Alexis AsaSarah Paulos is a 52 y.o. female here for a routine exam.  Current complaints: dry vagina and painful intercourse. .     Gynecologic History Patient's last menstrual period was 12/30/2012. Contraception: post menopausal status Last Pap: 2014. Results were: normal Last mammogram: 2019. Results were: birads 3  Obstetric History OB History  Gravida Para Term Preterm AB Living  3 2 1 1 1 2   SAB TAB Ectopic Multiple Live Births  1            # Outcome Date GA Lbr Len/2nd Weight Sex Delivery Anes PTL Lv  3 SAB           2 Preterm  [id number]    CS-Unspec     1 Term      Vag-Spont        Birth Comments: induction    The following portions of the patient's history were reviewed and updated as appropriate: allergies, current medications, past family history, past medical history, past social history, past surgical history and problem list.  Review of Systems Pertinent items noted in HPI and remainder of comprehensive ROS otherwise negative.    Objective:      Vitals:   07/27/18 0901  BP: 109/65  Pulse: 85  Resp: 16  Weight: 222 lb (100.7 kg)  Height: 5\' 10"  (1.778 m)   Vitals:  WNL General appearance: alert, cooperative and no distress  HEENT: Normocephalic, without obvious abnormality, atraumatic Eyes: negative Throat: lips, mucosa, and tongue normal; teeth and gums normal  Respiratory: Clear to auscultation bilaterally  CV: Regular rate and rhythm  Breasts:  Normal appearance, no masses or tenderness, no nipple retraction or dimpling  GI: Soft, non-tender; bowel sounds normal; no masses,  no organomegaly  GU: External Genitalia:  Tanner V, Red fleshy sub centimeter mass at base of vulva (6 0'clock; approx 0.5 cm   Urethra:  No prolapse  Pain bilateral skene's right > left Mild pain at vestibular glands R>L No pain over levator muscles  Vagina: Pink, normal rugae, no blood or discharge; minimal atrophic changes  Cervix: No CMT, no lesion  Uterus:  Normal  size and contour, non tender  Adnexa: Normal, no masses, non tender  Musculoskeletal: No edema, redness or tenderness in the calves or thighs  Skin: No lesions or rash  Lymphatic: Axillary adenopathy: none     Psychiatric: Normal mood and behavior        Assessment:    Healthy female exam.   Dyspareunia Vulvar lesion   Plan:   Pap with cotesting Vaginitis testing Mammogram up to date Yearly labs F/ U with PCP for physicial exam and for cologard RTC 1-2 weeks for lesion removal If no BV and still symptomatic at 2 weeks, can consider trial of vaginal estrogen.

## 2018-07-28 ENCOUNTER — Other Ambulatory Visit: Payer: Self-pay | Admitting: Obstetrics & Gynecology

## 2018-07-28 LAB — LIPID PANEL
CHOL/HDL RATIO: 4.5 (calc) (ref <5.0)
Cholesterol: 240 mg/dL — ABNORMAL HIGH (ref <200)
HDL: 53 mg/dL (ref >50)
LDL CHOLESTEROL (CALC): 167 mg/dL — AB
NON-HDL CHOLESTEROL (CALC): 187 mg/dL — AB (ref <130)
TRIGLYCERIDES: 92 mg/dL (ref <150)

## 2018-07-28 LAB — CBC
HCT: 41.8 % (ref 35.0–45.0)
Hemoglobin: 13.9 g/dL (ref 11.7–15.5)
MCH: 30.1 pg (ref 27.0–33.0)
MCHC: 33.3 g/dL (ref 32.0–36.0)
MCV: 90.5 fL (ref 80.0–100.0)
MPV: 10.1 fL (ref 7.5–12.5)
Platelets: 340 10*3/uL (ref 140–400)
RBC: 4.62 10*6/uL (ref 3.80–5.10)
RDW: 12.6 % (ref 11.0–15.0)
WBC: 4.8 10*3/uL (ref 3.8–10.8)

## 2018-07-28 LAB — CYTOLOGY - PAP
Bacterial vaginitis: NEGATIVE
Candida vaginitis: NEGATIVE
DIAGNOSIS: NEGATIVE
HPV: NOT DETECTED

## 2018-07-28 LAB — COMPREHENSIVE METABOLIC PANEL
AG Ratio: 1.9 (calc) (ref 1.0–2.5)
ALKALINE PHOSPHATASE (APISO): 74 U/L (ref 33–130)
ALT: 11 U/L (ref 6–29)
AST: 15 U/L (ref 10–35)
Albumin: 4.7 g/dL (ref 3.6–5.1)
BUN: 16 mg/dL (ref 7–25)
CHLORIDE: 104 mmol/L (ref 98–110)
CO2: 28 mmol/L (ref 20–32)
CREATININE: 0.96 mg/dL (ref 0.50–1.05)
Calcium: 9.7 mg/dL (ref 8.6–10.4)
Globulin: 2.5 g/dL (calc) (ref 1.9–3.7)
Glucose, Bld: 86 mg/dL (ref 65–99)
Potassium: 4.9 mmol/L (ref 3.5–5.3)
Sodium: 141 mmol/L (ref 135–146)
Total Bilirubin: 0.5 mg/dL (ref 0.2–1.2)
Total Protein: 7.2 g/dL (ref 6.1–8.1)

## 2018-07-28 LAB — VITAMIN D 25 HYDROXY (VIT D DEFICIENCY, FRACTURES): VIT D 25 HYDROXY: 16 ng/mL — AB (ref 30–100)

## 2018-07-28 LAB — TSH: TSH: 3.95 mIU/L

## 2018-07-28 MED ORDER — VITAMIN D (ERGOCALCIFEROL) 1.25 MG (50000 UNIT) PO CAPS: 50000.0000 [IU] | ORAL_CAPSULE | ORAL | 1 refills | Status: DC

## 2018-07-29 ENCOUNTER — Telehealth: Payer: Self-pay | Admitting: *Deleted

## 2018-07-29 NOTE — Telephone Encounter (Signed)
LM on cell phone that she does have low Vit D and RX was sent to her pharmacy and that her Lipids were elevated.  She is to f/u with Dr Lyn HollingsheadAlexander who is your PCP

## 2018-07-29 NOTE — Telephone Encounter (Signed)
-----   Message from Lesly DukesKelly H Leggett, MD sent at 07/28/2018  5:14 PM EST ----- She needs referral to PCP for high cholesterol and I ordered her Vitamin D and she needs a call.  ----- Message ----- From: Granville Lewislark, Markevius Trombetta L, RN Sent: 07/28/2018   4:31 PM EST To: Lesly DukesKelly H Leggett, MD  No I didn't get anything but I do know her Vit D is low. ----- Message ----- From: Lesly DukesLeggett, Kelly H, MD Sent: 07/28/2018   4:08 PM EST To: Everardo Allwh [place] [facility]  Did you get the vit D and cholesterol note?  I can't remember if I sent it to you.

## 2018-08-10 ENCOUNTER — Ambulatory Visit: Payer: 59 | Admitting: Obstetrics & Gynecology

## 2018-08-10 ENCOUNTER — Encounter: Payer: Self-pay | Admitting: Obstetrics & Gynecology

## 2018-08-10 VITALS — BP 120/78 | HR 72 | Resp 16 | Ht 70.0 in | Wt 222.0 lb

## 2018-08-10 DIAGNOSIS — N9089 Other specified noninflammatory disorders of vulva and perineum: Secondary | ICD-10-CM

## 2018-08-10 NOTE — Progress Notes (Signed)
   Subjective:    Patient ID: Alexis Yu, female    DOB: 09/05/1965, 52 y.o.   MRN: [id number]  HPI  Pt presents for f/u and vulvar biopsy.  Pt did not have BV and is amendable to vaginal estrogen.  If vaginal estrogen doesn't improve symptoms, will refer to PT.    Review of Systems  Respiratory: Negative.   Cardiovascular: Negative.   Gastrointestinal: Negative.   Genitourinary: Positive for vaginal pain. Negative for vaginal bleeding and vaginal discharge.       Objective:   Physical Exam Vitals signs reviewed.  Constitutional:      General: She is not in acute distress.    Appearance: She is well-developed.  HENT:     Head: Normocephalic and atraumatic.  Eyes:     Conjunctiva/sclera: Conjunctivae normal.  Cardiovascular:     Rate and Rhythm: Normal rate.  Pulmonary:     Effort: Pulmonary effort is normal.  Genitourinary:   Skin:    General: Skin is warm and dry.  Neurological:     Mental Status: She is alert and oriented to person, place, and time.         Assessment & Plan:  52 yo female with vulvar mass and atrohpic vaginitis  1.  Vulvar lesion removed 2.  Vagifem 3.  F/U with PCP with cholesterol and vitamin D.    VULVAR BIOPSY NOTE  The indications for vulvar biopsy were reviewed.   Risks of the biopsy including pain, bleeding, infection, inadequate specimen, scarring and need for additional procedures  were discussed. The patient stated understanding and agreed to undergo procedure today. Consent was signed,  time out performed.  The patient's vulva was prepped with Betadine. 1% lidocaine was injected into vulva. A scalpel was used to remove the sessile lesion.  The tissue was picked up with sterile forceps and sterile scissors were used to excise the lesion.  Small bleeding was noted and hemostasis was achieved using silver nitrate sticks.  The patient tolerated the procedure well. Post-procedure instructions  (pelvic rest for one-two weeks) were given to  the patient. The patient is to call with heavy bleeding, fever greater than 100.4, foul smelling vaginal discharge or other concerns.

## 2018-08-10 NOTE — Patient Instructions (Signed)
Vulva Biopsy, Care After These instructions give you information about caring for yourself after your procedure. Your doctor may also give you more specific instructions. Call your doctor if you have any problems or questions after your procedure. Follow these instructions at home: Biopsy Site Care   Do not rub the biopsy area after peeing (urinating). Gently: ? Pat the area dry. Or, use a bottle filled with warm water (peri-bottle) to clean the area. ? Wipe from front to back.  Follow instructions from your doctor about how to take care of your biopsy site. Make sure you: ? Clean the area using water and mild soap twice a day or as told by your doctor. Gently pat the area dry. ? If you were prescribed an antibiotic medical ointment, apply it as told by your doctor. Do not stop using the antibiotic even if your condition gets better. ? Take a warm water bath that is taken while you are sitting down (sitz bath). Do this as needed to help with pain. ? Leave stitches (sutures), skin glue, or skin tape (adhesive) strips in place. They may need to stay in place for 2 weeks or longer. If tape strips get loose and curl up, you may trim the loose edges. Do not remove tape strips completely unless your doctor says it is okay.  Check your biopsy site every day for signs of infection. Check for: ? More redness, swelling, or pain. ? More fluid or blood. ? Warmth. ? Pus or a bad smell. Lifestyle  Wear loose, cotton underwear.  Do not wear tight pants.  Do not use a tampon, douche, or put anything in your vagina for at least one week or until your doctor says it is okay.  Do not have sex for at least one week or until your doctor says it is okay.  Do not exercise until your doctor says it is okay.  Do not take baths, swim, or use a hot tub until your doctor says it is okay. General instructions  Take over-the-counter and prescription medicines only as told by your doctor.  Use a sanitary pad  until bleeding stops.  Keep all follow-up visits as told by your doctor. This is important.  If the sample is being sent for testing, it is your responsibility to get the results of your procedure. Ask your doctor or the department doing the procedure when your results will be ready. Contact a doctor if:  You have more redness, swelling, or pain around your biopsy site.  You have more fluid or blood coming from your biopsy site.  Your biopsy site feels warm when you touch it.  Medicine does not help your pain. Get help right away if:  You have a lot of bleeding from the vulva.  You have pus or a bad smell coming from your biopsy site.  You have a fever.  You have lower belly pain. This information is not intended to replace advice given to you by your health care provider. Make sure you discuss any questions you have with your health care provider. Document Released: 11/08/2008 Document Revised: 01/18/2016 Document Reviewed: 07/03/2015 Elsevier Interactive Patient Education  2018 [employer]vier Inc.  

## 2018-08-11 ENCOUNTER — Telehealth: Payer: Self-pay | Admitting: Osteopathic Medicine

## 2018-08-11 ENCOUNTER — Encounter: Payer: Self-pay | Admitting: Osteopathic Medicine

## 2018-08-11 ENCOUNTER — Ambulatory Visit: Payer: 59 | Admitting: Osteopathic Medicine

## 2018-08-11 VITALS — BP 115/75 | HR 91 | Temp 98.1°F | Wt 224.4 lb

## 2018-08-11 DIAGNOSIS — E559 Vitamin D deficiency, unspecified: Secondary | ICD-10-CM

## 2018-08-11 DIAGNOSIS — E789 Disorder of lipoprotein metabolism, unspecified: Secondary | ICD-10-CM

## 2018-08-11 NOTE — Telephone Encounter (Signed)
-----   Message from Sunnie NielsenNatalie Alexander, DO sent at 08/11/2018  9:17 AM EST ----- Regarding: shingles! Shingles list

## 2018-08-11 NOTE — Progress Notes (Signed)
HPI: Alexis Yu is a 52 y.o. female who  has a past medical history of Antiphospholipid antibody positive, Asthma, and Pneumonia.  [facility] today, 08/11/18,  for chief complaint of:  Labs follow-up  Recently had blood work done at OB/GYN, concern for low vitamin D and elevated cholesterol.  Patient reports she could be doing better as far as diet/exercise is concerned.  Has been under more stress over the past couple of years, still grieving the loss of her father.  She and her husband do not eat very healthy at lunchtime, relatively healthy at breakfast/dinner.      At today's visit... Past medical history, surgical history, and family history reviewed and updated as needed.  Current medication list and allergy/intolerance information reviewed and updated as needed. (See remainder of HPI, ROS, Phys Exam below)   Recent Results (from the past 2160 hour(s))  Cytology - PAP( [place])     Status: None   Collection Time: 07/27/18 12:00 AM  Result Value Ref Range   Adequacy      Satisfactory for evaluation  endocervical/transformation zone component PRESENT.   Diagnosis      NEGATIVE FOR INTRAEPITHELIAL LESIONS OR MALIGNANCY.   Bacterial vaginitis Negative for Bacterial Vaginitis Microorganisms     Comment: Normal Reference Range - Negative   Candida vaginitis Negative for Candida species     Comment: Normal Reference Range - Negative   HPV NOT DETECTED     Comment: Normal Reference Range - NOT Detected   Material Submitted CervicoVaginal Pap [ThinPrep Imaged]   Vitamin D (25 hydroxy)     Status: Abnormal   Collection Time: 07/27/18 10:05 AM  Result Value Ref Range   Vit D, 25-Hydroxy 16 (L) 30 - 100 ng/mL    Comment: Vitamin D Status         25-OH Vitamin D: . Deficiency:                    <20 ng/mL Insufficiency:             20 - 29 ng/mL Optimal:                 > or = 30 ng/mL . For 25-OH Vitamin D testing on  patients on  D2-supplementation and patients for whom quantitation  of D2 and D3 fractions is required, the QuestAssureD(TM) 25-OH VIT D, (D2,D3), LC/MS/MS is recommended: order  code [id number] (patients >3021yrs). . For more information on this test, go to: http://education.questdiagnostics.com/faq/FAQ163 (This link is being provided for  informational/educational purposes only.)   TSH     Status: None   Collection Time: 07/27/18 10:06 AM  Result Value Ref Range   TSH 3.95 mIU/L    Comment:           Reference Range .           > or = 20 Years  0.40-4.50 .                Pregnancy Ranges           First trimester    0.26-2.66           Second trimester   0.55-2.73           Third trimester    0.43-2.91   CBC     Status: None   Collection Time: 07/27/18 10:06 AM  Result Value Ref Range   WBC 4.8 3.8 - 10.8  Thousand/uL   RBC 4.62 3.80 - 5.10 Million/uL   Hemoglobin 13.9 11.7 - 15.5 g/dL   HCT 04.5 40.9 - 81.1 %   MCV 90.5 80.0 - 100.0 fL   MCH 30.1 27.0 - 33.0 pg   MCHC 33.3 32.0 - 36.0 g/dL   RDW 91.4 78.2 - 95.6 %   Platelets 340 140 - 400 Thousand/uL   MPV 10.1 7.5 - 12.5 fL  Lipid panel     Status: Abnormal   Collection Time: 07/27/18 10:06 AM  Result Value Ref Range   Cholesterol 240 (H) <200 mg/dL   HDL 53 >21 mg/dL   Triglycerides 92 <308 mg/dL   LDL Cholesterol (Calc) 167 (H) mg/dL (calc)    Comment: Reference range: <100 . Desirable range <100 mg/dL for primary prevention;   <70 mg/dL for patients with CHD or diabetic patients  with > or = 2 CHD risk factors. Marland Kitchen LDL-C is now calculated using the Martin-Hopkins  calculation, which is a validated novel method providing  better accuracy than the Friedewald equation in the  estimation of LDL-C.  Horald Pollen et al. Lenox Ahr. 6578;469(62): 2061-2068  (http://education.[facility].com/faq/FAQ164)    Total CHOL/HDL Ratio 4.5 <5.0 (calc)   Non-HDL Cholesterol (Calc) 187 (H) <130 mg/dL (calc)    Comment: For patients  with diabetes plus 1 major ASCVD risk  factor, treating to a non-HDL-C goal of <100 mg/dL  (LDL-C of <95 mg/dL) is considered a therapeutic  option.   Comprehensive metabolic panel     Status: None   Collection Time: 07/27/18 10:06 AM  Result Value Ref Range   Glucose, Bld 86 65 - 99 mg/dL    Comment: .            Fasting reference interval .    BUN 16 7 - 25 mg/dL   Creat 2.84 1.32 - 4.40 mg/dL    Comment: For patients >87 years of age, the reference limit for Creatinine is approximately 13% higher for people identified as African-American. .    BUN/Creatinine Ratio NOT APPLICABLE 6 - 22 (calc)   Sodium 141 135 - 146 mmol/L   Potassium 4.9 3.5 - 5.3 mmol/L   Chloride 104 98 - 110 mmol/L   CO2 28 20 - 32 mmol/L   Calcium 9.7 8.6 - 10.4 mg/dL   Total Protein 7.2 6.1 - 8.1 g/dL   Albumin 4.7 3.6 - 5.1 g/dL   Globulin 2.5 1.9 - 3.7 g/dL (calc)   AG Ratio 1.9 1.0 - 2.5 (calc)   Total Bilirubin 0.5 0.2 - 1.2 mg/dL   Alkaline phosphatase (APISO) 74 33 - 130 U/L   AST 15 10 - 35 U/L   ALT 11 6 - 29 U/L          ASSESSMENT/PLAN: The primary encounter diagnosis was Borderline high cholesterol. A diagnosis of Vitamin D deficiency was also pertinent to this visit.   Reviewed labs in detail with patient, she is not in statin benefit group but I advised if she wanted to take over-the-counter fish or Krill oil or other omega-3, red yeast rice, this was okay.  Would work on diet/exercise and let us plan to recheck in 6 months.  Up-to-date on preventive care via OB/GYN, only real deficiency is Shingrix vaccine.  If insurance requires her to get an annual physical with family practice, we will certainly see her back, otherwise patient preference  The 10-year ASCVD risk score Denman George DC Jr., et al., 2013) is: 1.6%   Values used  to calculate the score:     Age: 69 years     Sex: Female     Is Non-Hispanic African American: No     Diabetic: No     Tobacco smoker: No     Systolic  Blood Pressure: 115 mmHg     Is BP treated: No     HDL Cholesterol: 53 mg/dL     Total Cholesterol: 240 mg/dL  Orders Placed This Encounter  Procedures  . Lipid Panel w/reflex Direct LDL  . VITAMIN D 25 Hydroxy (Vit-D Deficiency, Fractures)        Follow-up plan: Return in about 6 months (around 02/10/2019) for LAB VISIT ONLY to recheck Vitamin D and Cholesterol - see me sooner if needed! .                             ############################################ ############################################ ############################################ ############################################    Current Meds  Medication Sig  . Biotin 1 MG CAPS Take by mouth.  . ELDERBERRY PO Take by mouth.  . Liniments (DEEP BLUE RELIEF EX) Apply topically.  . Probiotic Product (PROBIOTIC-10 PO) Take by mouth.  . Spacer/Aero-Holding Chambers (E-Z SPACER) inhaler Use as instructed  . VENTOLIN HFA 108 (90 Base) MCG/ACT inhaler INHALE 2 PUFFS INTO THE LUNGS EVERY 6 HOURS AS NEEDED FOR WHEEZING  . Vitamin D, Ergocalciferol, (DRISDOL) 1.25 MG (50000 UT) CAPS capsule Take 1 capsule (50,000 Units total) by mouth every 7 (seven) days.    No Known Allergies     Review of Systems:  Constitutional: No recent illness  Cardiac: No  chest pain  Respiratory:  No  shortness of breath.  Gastrointestinal: No  abdominal pain  Psychiatric: No  concerns with depression, +concerns with anxiety  Exam:  BP 115/75 (BP Location: Left Arm, Patient Position: Sitting, Cuff Size: Normal)   Pulse 91   Temp 98.1 F (36.7 C) (Oral)   Wt 224 lb 6.4 oz (101.8 kg)   LMP 12/30/2012   BMI 32.20 kg/m   Constitutional: VS see above. General Appearance: alert, well-developed, well-nourished, NAD  Eyes: Normal lids and conjunctive, non-icteric sclera  Ears, Nose, Mouth, Throat: MMM, Normal external inspection ears/nares/mouth/lips/gums.  Neck: No masses, trachea midline.    Respiratory: Normal respiratory effort. no wheeze, no rhonchi, no rales  Cardiovascular: S1/S2 normal, no murmur, no rub/gallop auscultated. RRR.   Musculoskeletal: Gait normal. Symmetric and independent movement of all extremities  Abdominal: non-tender, non-distended, no appreciable organomegaly, neg Murphy's, BS WNLx4  Neurological: Normal balance/coordination. No tremor.  Skin: warm, dry, intact.   Psychiatric: Normal judgment/insight. Normal mood and affect. Oriented x3.       Visit summary with medication list and pertinent instructions was printed for patient to review, patient was advised to alert [place] if any updates are needed. All questions at time of visit were answered - patient instructed to contact office with any additional concerns. ER/RTC precautions were reviewed with the patient and understanding verbalized.   Note: Total time spent 25 minutes, greater than 50% of the visit was spent face-to-face counseling and coordinating care for the following: The primary encounter diagnosis was Borderline high cholesterol. A diagnosis of Vitamin D deficiency was also pertinent to this visit.Marland Kitchen  Please note: voice recognition software was used to produce this document, and typos may escape review. Please contact Dr. Lyn Hollingshead for any needed clarifications.    Follow up plan: Return in about 6 months (around 02/10/2019) for LAB VISIT  ONLY to recheck Vitamin D and Cholesterol - see me sooner if needed! Marland Kitchen

## 2018-08-11 NOTE — Telephone Encounter (Signed)
Added

## 2018-08-21 ENCOUNTER — Telehealth: Payer: Self-pay | Admitting: *Deleted

## 2018-08-21 MED ORDER — ESTRADIOL 10 MCG VA TABS: 1.0000 | ORAL_TABLET | VAGINAL | 12 refills | Status: AC

## 2018-08-21 NOTE — Telephone Encounter (Signed)
-----   Message from Lesly DukesKelly H Leggett, MD sent at 08/21/2018  6:51 AM EST ----- Regarding: RE: Estrogen I think my note from dec 16th says vagifem.  (under assessment and plan).  Can you call it in.  If it is cost prohibitive, we can order estrace with coupon.  If you use Estrace Vaginal cream (Estrace): Insert 2 to 4 g daily intravaginally for 1 to 2 weeks, then gradually reduce to 1/2 the initial dose for 1 to 2 weeks, followed by a maintenance dose of 1 g 1 to 3 times per week.  Start with 2 gm daily for 1 week, then go to 1 gm daily for 1 week then 1 gm twice a week  ----- Message ----- From: Granville Lewislark, Harmonie Verrastro L, RN Sent: 08/20/2018   1:15 PM EST To: Lesly DukesKelly H Leggett, MD Subject: Estrogen                                       Patient Evelena AsaSarah Nakagawa said you were going to send in a RX for Estrogen to her pharmacy.  Only thing in chart is may do a trial of Estrogen.  Is it Estrace cream, Vagifem or What?  Biweekly? Thanks

## 2018-08-21 NOTE — Telephone Encounter (Signed)
Spoke with Dr Penne LashLeggett about orders for vaginal Estrogen.  Per her notes pt may try Vagifem 10 mcg Biweekly or Estrace cream.  [facility] and encouraged to get a discount coupon on line.

## 2018-09-09 ENCOUNTER — Ambulatory Visit (INDEPENDENT_AMBULATORY_CARE_PROVIDER_SITE_OTHER): Payer: 59 | Admitting: Osteopathic Medicine

## 2018-09-09 VITALS — BP 128/69 | HR 67 | Temp 98.4°F | Wt 223.0 lb

## 2018-09-09 DIAGNOSIS — Z23 Encounter for immunization: Secondary | ICD-10-CM

## 2018-09-09 NOTE — Progress Notes (Signed)
Pt in today for shingrix vaccine. This is 1 of 2 of the shingrix series. Vitals taken and no fever noted. Vaccine was given in l deltoid. Pt tolerated well with no immediate complications.

## 2018-10-04 ENCOUNTER — Other Ambulatory Visit: Payer: Self-pay | Admitting: Obstetrics & Gynecology

## 2018-10-12 ENCOUNTER — Ambulatory Visit: Payer: 59 | Admitting: Obstetrics & Gynecology

## 2018-11-16 ENCOUNTER — Ambulatory Visit: Payer: 59 | Admitting: Obstetrics & Gynecology

## 2018-11-16 ENCOUNTER — Telehealth: Payer: Self-pay | Admitting: Osteopathic Medicine

## 2018-11-16 NOTE — Telephone Encounter (Signed)
Patient would like to know how far she can push out her appointment for [facility]. Please advise.

## 2018-11-17 NOTE — Telephone Encounter (Signed)
Second shingrix vaccine is due 2-6 months after first one. So she got her first in January, due for second by June.

## 2018-11-18 ENCOUNTER — Ambulatory Visit: Payer: 59

## 2018-12-30 ENCOUNTER — Ambulatory Visit (INDEPENDENT_AMBULATORY_CARE_PROVIDER_SITE_OTHER): Payer: 59 | Admitting: Family Medicine

## 2018-12-30 VITALS — Temp 98.1°F

## 2018-12-30 DIAGNOSIS — Z23 Encounter for immunization: Secondary | ICD-10-CM | POA: Diagnosis not present

## 2018-12-30 NOTE — Progress Notes (Signed)
Pt came into clinic today for second shingrix vaccine. Reports no negative side effects from first vaccine. Tolerated in left deltoid well, no immediate complications. No further questions or concerns.

## 2019-01-27 ENCOUNTER — Telehealth: Payer: Self-pay | Admitting: *Deleted

## 2019-01-27 NOTE — Telephone Encounter (Signed)
Left patient a message to call and schedule F/U appointment if still needed that was cancelled on 11/16/2018 due to COVID-19.

## 2019-02-16 ENCOUNTER — Telehealth: Payer: 59 | Admitting: Physician Assistant

## 2019-02-16 DIAGNOSIS — R0981 Nasal congestion: Secondary | ICD-10-CM | POA: Diagnosis not present

## 2019-02-16 MED ORDER — IPRATROPIUM BROMIDE 0.03 % NA SOLN: 2.0000 | Freq: Three times a day (TID) | NASAL | 0 refills | Status: DC

## 2019-02-16 MED ORDER — AMOXICILLIN-POT CLAVULANATE 875-125 MG PO TABS: 1.0000 | ORAL_TABLET | Freq: Two times a day (BID) | ORAL | 0 refills | Status: AC

## 2019-02-16 NOTE — Progress Notes (Signed)
E visit for Allergic Rhinitis We are sorry that you are not feeling well.  Here is how we plan to help!  Based on what you have shared with me it looks like you have Allergic Rhinitis.  Rhinitis is when a reaction occurs that causes nasal congestion, runny nose, sneezing, and itching.  Most types of rhinitis are caused by an inflammation and are associated with symptoms in the eyes ears or throat. There are several types of rhinitis.  The most common are acute rhinitis, which is usually caused by a viral illness, allergic or seasonal rhinitis, and nonallergic or year-round rhinitis.  Nasal allergies occur certain times of the year.  Allergic rhinitis is caused when allergens in the air trigger the release of histamine in the body.  Histamine causes itching, swelling, and fluid to build up in the fragile linings of the nasal passages, sinuses and eyelids.  An itchy nose and clear discharge are common.  I recommend the following over the counter treatments: Xyzal 5 mg take 1 tablet daily  I also would recommend a nasal spray: Flonase 2 sprays into each nostril once daily.  To help with congestion I am prescribing ipratropium.  If these things fail to help after three days then just email us back so we can change your management.    HOME CARE:  You can use an over-the-counter saline nasal spray as needed Avoid areas where there is heavy dust, mites, or molds Stay indoors on windy days during the pollen season Keep windows closed in home, at least in bedroom; use air conditioner. Use high-efficiency house air filter Keep windows closed in car, turn AC on re-circulate Avoid playing out with dog during pollen season  GET HELP RIGHT AWAY IF:  If your symptoms do not improve within 10 days You become short of breath You develop yellow or green discharge from your nose for over 3 days You have coughing fits  MAKE SURE YOU:  Understand these instructions Will watch your condition Will get  help right away if you are not doing well or get worse  Thank you for choosing an e-visit. Your e-visit answers were reviewed by a board certified advanced clinical practitioner to complete your personal care plan. Depending upon the condition, your plan could have included both over the counter or prescription medications. Please review your pharmacy choice. Be sure that the pharmacy you have chosen is open so that you can pick up your prescription now.  If there is a problem you may message your provider in MyChart to have the prescription routed to another pharmacy. Your safety is important to us. If you have drug allergies check your prescription carefully.  For the next 24 hours, you can use MyChart to ask questions about today's visit, request a non-urgent call back, or ask for a work or school excuse from your e-visit provider. You will get an email in the next two days asking about your experience. I hope that your e-visit has been valuable and will speed your recovery.         ===View-only below this line===   ----- Message -----    From: Evelena AsaSarah Pajak    Sent: 02/16/2019  2:39 PM EDT      To: E-Visit Mailing List Subject: Sinus Problems  Sinus Problems --------------------------------  Question: Which of the following have you been experiencing? Answer:   Congested nose            Ear pain  Question: Have these symptoms  significantly worsened over the last two to three days? Answer:   No  Question: Have you had any of the following? Answer:   None of the above  Question: How long have you been having these symptoms? Answer:   7 or more days  Question: Do you have a fever? Answer:   No, I do not have a fever  Question: Do you smoke? Answer:   No  Question: Have you ever smoked? Answer:   I have never smoked  Question: Do you have any chronic illnesses, such as diabetes, heart disease, kidney disease, or lung disease, or any illness that would weaken your body's  ability to fight infection? Answer:   No  Question: When you blow your nose, what color is the mucus? Answer:   Mostly thick and yellow or green  Question: Have you experienced similar problems in the past? Answer:   Yes  Question: What treatments have worked in the past?  Answer:   Antibiotics  Question: What treatment(s) in the past have been unsuccessful? Answer:   OTC meds  Question: Is this illness similar to previous illnesses you have had?  How is it the same?  How is it different? Answer:   I've had sinus and ear infections usually associated with spring allergy season which I've been having a really tough time with this year.  Question: Have you recently been hospitalized? Answer:   No  Question: What medications are you currently taking for these symptoms? Answer:   Decongestants            Nose spray  Question: Please enter the names of any medications you are taking, or any other treatments you are trying. Answer:   Mucinex Sinus Max and Severe Congestion nasal mist  Question: Please list your medication allergies that you may have ? (If 'none' , please list as 'none') Answer:   None  Question: Please list any additional comments  Answer:     A total of 5-10 minutes was spent evaluating this patients questionnaire and formulating a plan of care.

## 2019-02-16 NOTE — Addendum Note (Signed)
Addended by: Tereasa Coop on: 02/16/2019 03:33 PM   Modules accepted: Orders

## 2019-03-06 ENCOUNTER — Other Ambulatory Visit: Payer: Self-pay | Admitting: Osteopathic Medicine

## 2019-03-06 DIAGNOSIS — J4521 Mild intermittent asthma with (acute) exacerbation: Secondary | ICD-10-CM

## 2019-03-06 DIAGNOSIS — Z8709 Personal history of other diseases of the respiratory system: Secondary | ICD-10-CM

## 2019-03-08 MED ORDER — ALBUTEROL SULFATE HFA 108 (90 BASE) MCG/ACT IN AERS: 2.0000 | INHALATION_SPRAY | Freq: Four times a day (QID) | RESPIRATORY_TRACT | 1 refills | Status: DC | PRN

## 2019-08-12 ENCOUNTER — Encounter: Payer: Self-pay | Admitting: Osteopathic Medicine

## 2019-08-12 ENCOUNTER — Ambulatory Visit (INDEPENDENT_AMBULATORY_CARE_PROVIDER_SITE_OTHER): Payer: 59 | Admitting: Osteopathic Medicine

## 2019-08-12 ENCOUNTER — Other Ambulatory Visit: Payer: Self-pay

## 2019-08-12 VITALS — Temp 97.5°F | Wt 220.0 lb

## 2019-08-12 DIAGNOSIS — N3000 Acute cystitis without hematuria: Secondary | ICD-10-CM

## 2019-08-12 MED ORDER — CIPROFLOXACIN HCL 500 MG PO TABS: 500.0000 mg | ORAL_TABLET | Freq: Two times a day (BID) | ORAL | 0 refills | Status: DC

## 2019-08-12 NOTE — Progress Notes (Signed)
Virtual Visit via Video (App used: Doximity) Note  I connected with      Alexis Yu on 08/12/19 at 3:33 PM  by a telemedicine application and verified that I am speaking with the correct person using two identifiers.  Patient is at home I am in office   I discussed the limitations of evaluation and management by telemedicine and the availability of in person appointments. The patient expressed understanding and agreed to proceed.  History of Present Illness: Alexis Yu is a 53 y.o. female who would like to discuss UTI    Few days, UTI type symptoms, Azo helped but worse yesterday, severe pain and increased urinary urgency/frequency, feeling of incomplete emptying, ongoing for 2 weeks total.         Observations/Objective: Temp (!) 97.5 F (36.4 C) (Oral)   Wt 220 lb (99.8 kg)   LMP 12/30/2012   BMI 31.57 kg/m  BP Readings from Last 3 Encounters:  09/09/18 128/69  08/11/18 115/75  08/10/18 120/78   Exam: Normal Speech.  NAD  Lab and Radiology Results No results found for this or any previous visit (from the past 72 hour(s)). No results found.     Assessment and Plan: 53 y.o. female with The encounter diagnosis was Acute cystitis without hematuria.  Patient unable to come give a urine specimen today before lab closes.  I am okay to go ahead and start antibiotics for complicated UTI given 2+ weeks of symptoms classic for UTI.  PDMP not reviewed this encounter. No orders of the defined types were placed in this encounter.  Meds ordered this encounter  Medications  . ciprofloxacin (CIPRO) 500 MG tablet    Sig: Take 1 tablet (500 mg total) by mouth 2 (two) times daily.    Dispense:  20 tablet    Refill:  0      Follow Up Instructions: Return if symptoms worsen or fail to improve.    I discussed the assessment and treatment plan with the patient. The patient was provided an opportunity to ask questions and all were answered. The patient agreed with  the plan and demonstrated an understanding of the instructions.   The patient was advised to call back or seek an in-person evaluation if any new concerns, if symptoms worsen or if the condition fails to improve as anticipated.  15 minutes of non-face-to-face time was provided during this encounter.      . . . . . . . . . . . . . Marland Kitchen                   Historical information moved to improve visibility of documentation.  Past Medical History:  Diagnosis Date  . Antiphospholipid antibody positive   . Asthma   . Pneumonia    Past Surgical History:  Procedure Laterality Date  . CESAREAN SECTION  1993  . SHOULDER SURGERY Right    Social History   Tobacco Use  . Smoking status: Never Smoker  . Smokeless tobacco: Never Used  Substance Use Topics  . Alcohol use: Yes    Comment: occassional   family history includes Cancer in her paternal uncle; Depression in her sister; Diabetes in her father; Heart disease in her maternal grandmother and paternal grandfather; Stroke in her father, maternal grandmother, and mother.  Medications: Current Outpatient Medications  Medication Sig Dispense Refill  . albuterol (VENTOLIN HFA) 108 (90 Base) MCG/ACT inhaler Inhale 2 puffs into the lungs every 6 (six) hours as  needed for wheezing or shortness of breath. 18 g 1  . Biotin 1 MG CAPS Take by mouth.    . ELDERBERRY PO Take by mouth.    . Estradiol 10 MCG TABS vaginal tablet Place 1 tablet (10 mcg total) vaginally 2 (two) times a week. 8 tablet 12  . ipratropium (ATROVENT) 0.03 % nasal spray Place 2 sprays into both nostrils 3 (three) times daily. 30 mL 0  . Liniments (DEEP BLUE RELIEF EX) Apply topically.    . Probiotic Product (PROBIOTIC-10 PO) Take by mouth.    . Spacer/Aero-Holding Chambers (E-Z SPACER) inhaler Use as instructed 1 each 2  . ciprofloxacin (CIPRO) 500 MG tablet Take 1 tablet (500 mg total) by mouth 2 (two) times daily. 20 tablet 0  . Vitamin D,  Ergocalciferol, (DRISDOL) 1.25 MG (50000 UT) CAPS capsule Take 1 capsule (50,000 Units total) by mouth every 7 (seven) days. (Patient not taking: Reported on 08/12/2019) 4 capsule 1   No current facility-administered medications for this visit.   No Known Allergies

## 2019-12-04 IMAGING — MG DIGITAL SCREENING BILATERAL MAMMOGRAM WITH TOMO AND CAD
6 of 10 series · 6 of 30 positions shown · non-contrast
Comparison: Previous exam(s).

CLINICAL DATA: Screening.

EXAM:
DIGITAL SCREENING BILATERAL MAMMOGRAM WITH TOMO AND CAD

[R MLO synth-2D]
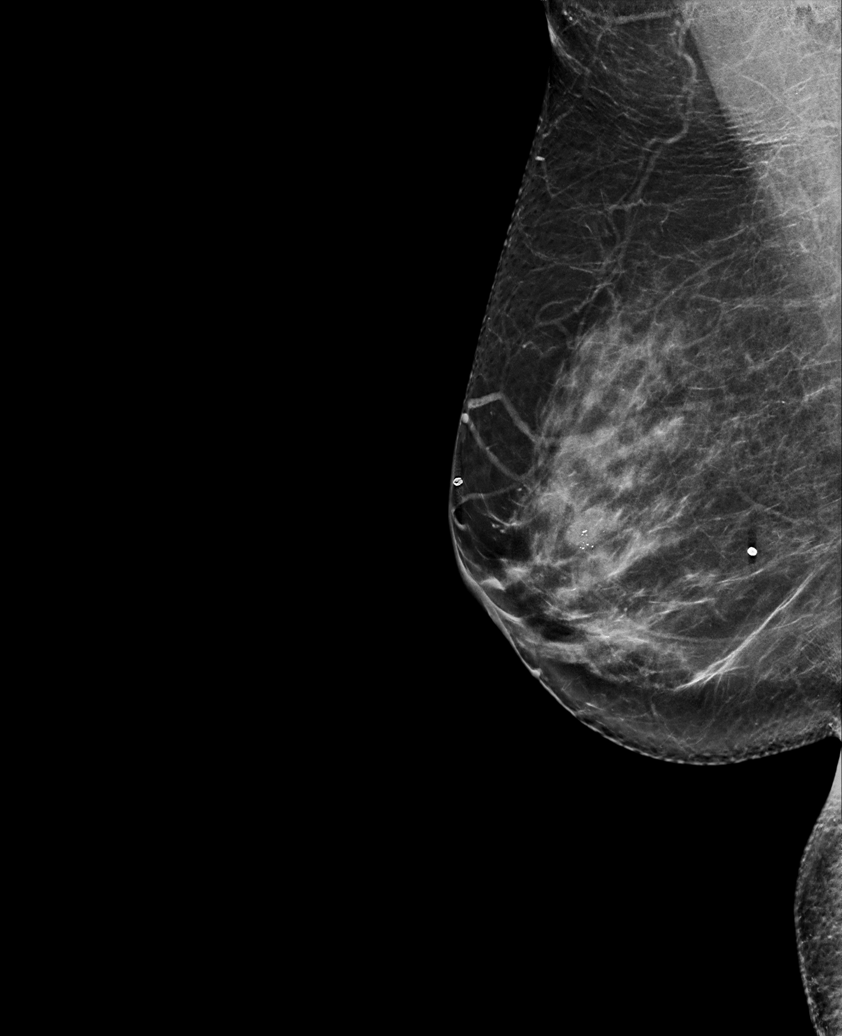

[L MLO synth-2D]
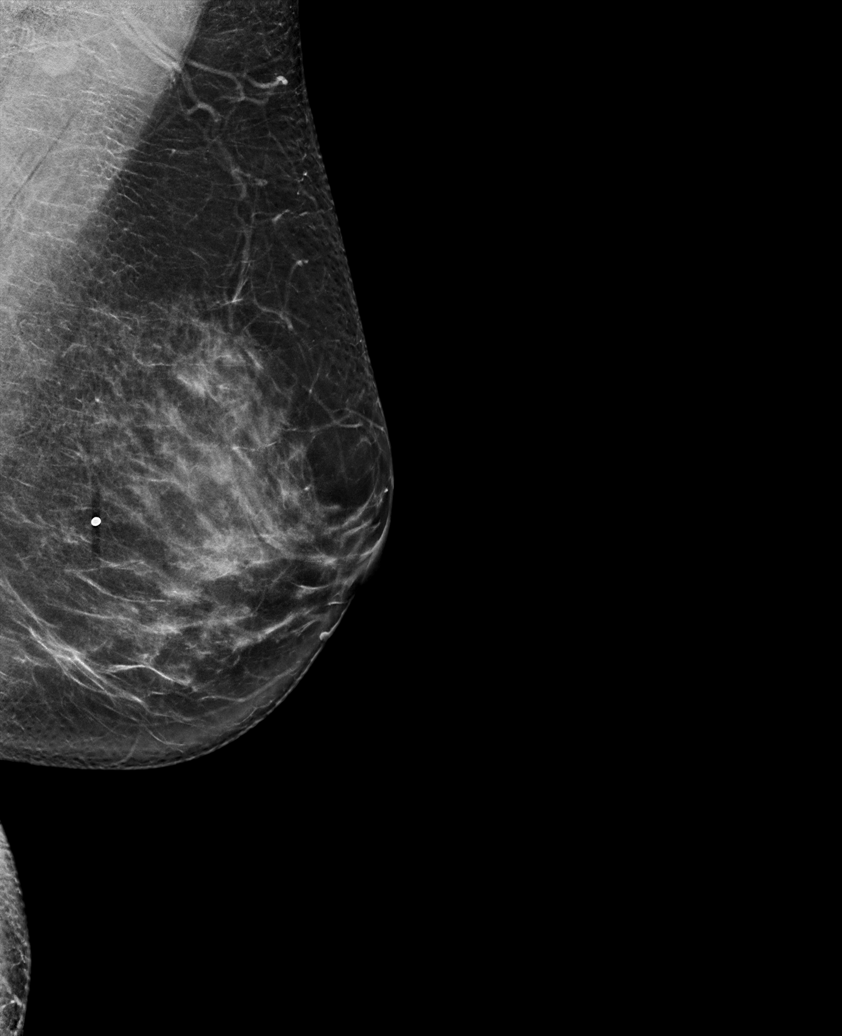

[L CC synth-2D]
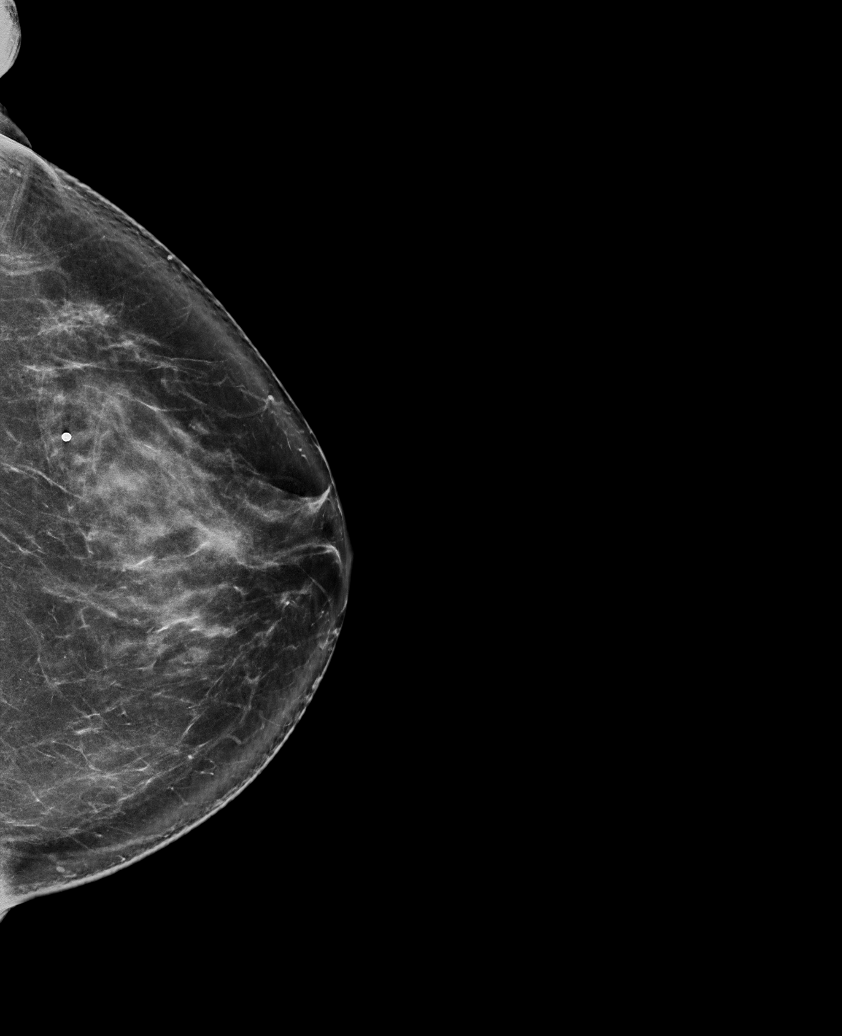

[R XCCL synth-2D]
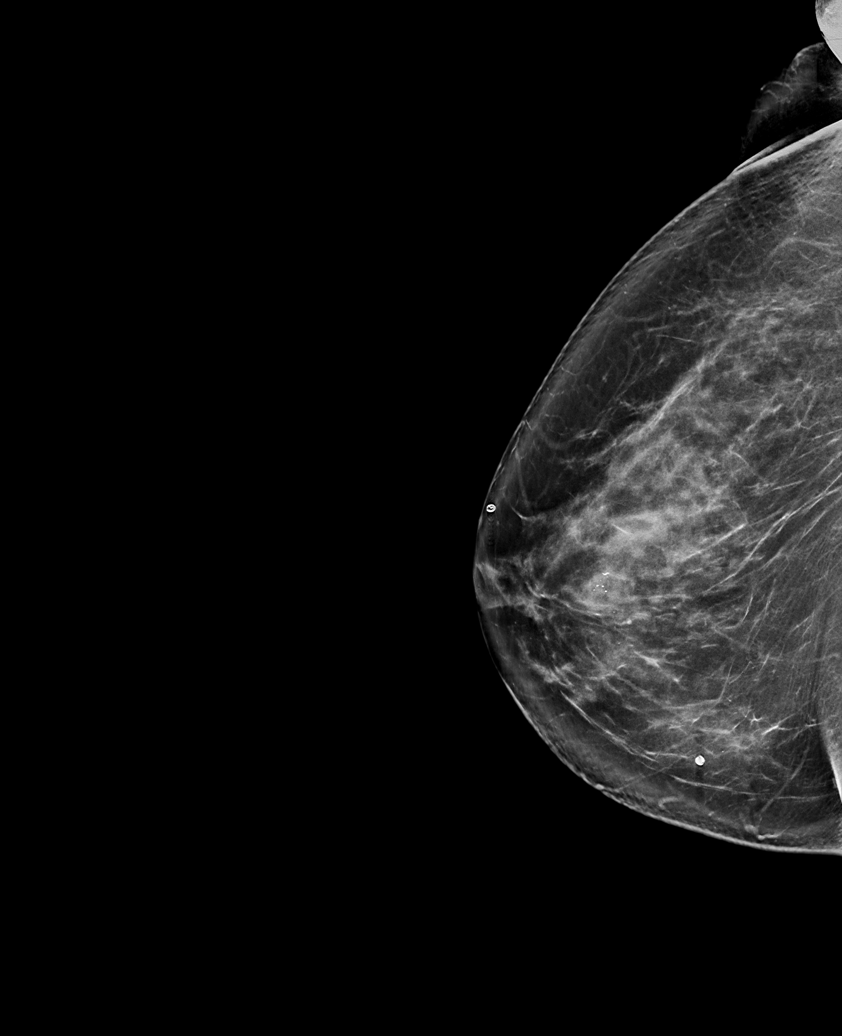

[R CC synth-2D]
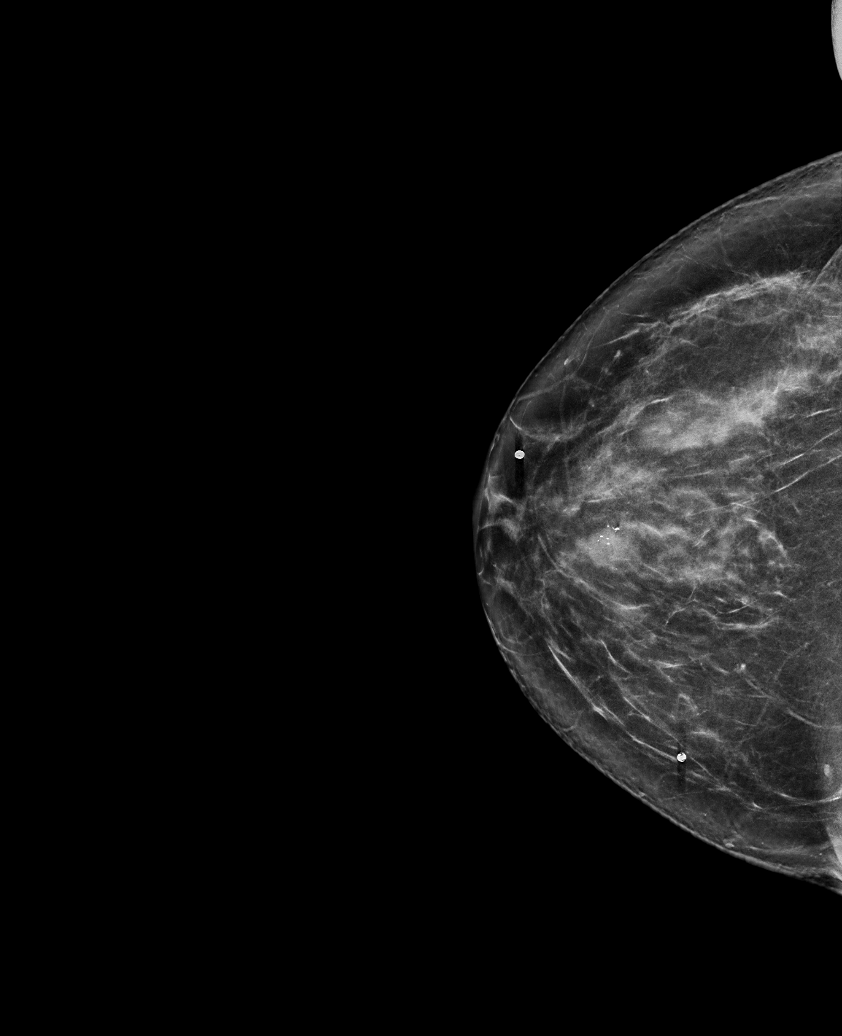

[R XCCL tomo · tomo slice 43/85.0]
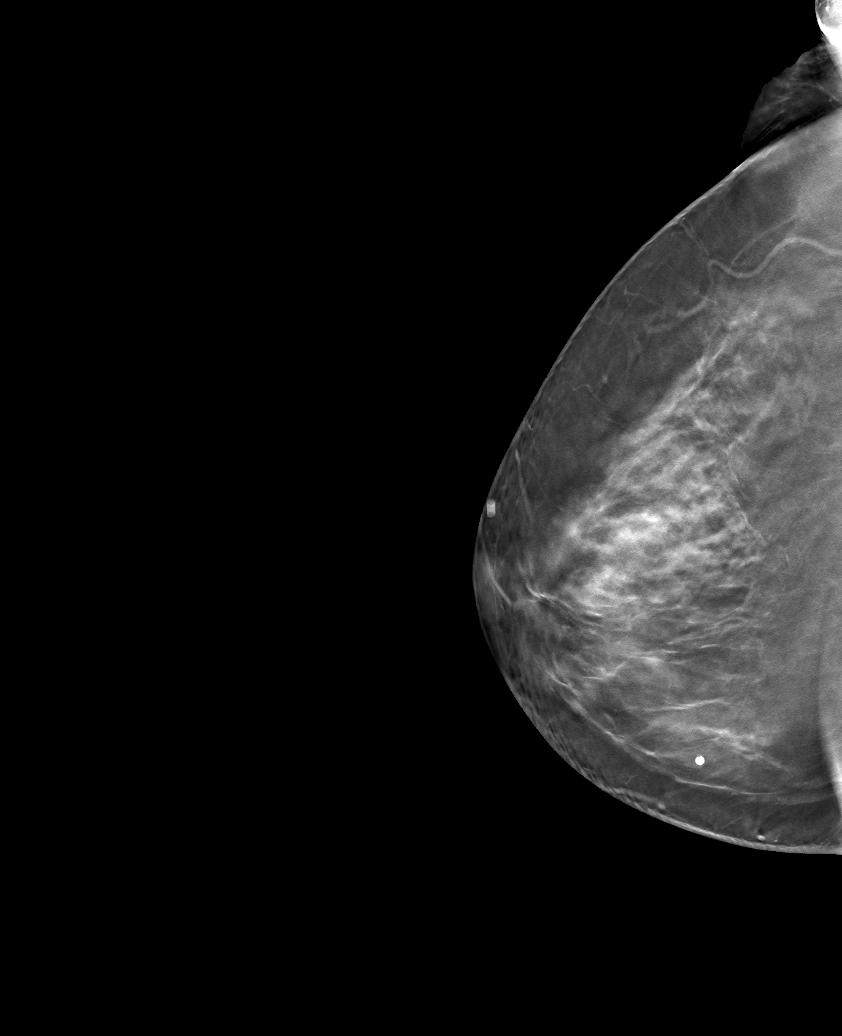

[6 of 30 positions shown; findings below may reference images not displayed]

ACR Breast Density Category c: The breast tissue is heterogeneously
dense, which may obscure small masses.
FINDINGS: In the right breast, a possible mass with possible
microcalcifications warrants further evaluation. In the left breast,
no findings suspicious for malignancy. Images were processed with
CAD.
IMPRESSION: Further evaluation is suggested for possible mass with possible
microcalcifications in the right breast.

RECOMMENDATION:
Diagnostic mammogram and possibly ultrasound of the right breast.
(Code:[id number])

The patient will be contacted regarding the findings, and additional
imaging will be scheduled.

BI-RADS CATEGORY  0: Incomplete. Need additional imaging evaluation
and/or prior mammograms for comparison.

## 2020-10-23 ENCOUNTER — Telehealth: Payer: 59 | Admitting: Physician Assistant

## 2020-10-23 DIAGNOSIS — R300 Dysuria: Secondary | ICD-10-CM

## 2020-10-23 MED ORDER — CEPHALEXIN 500 MG PO CAPS: 500.0000 mg | ORAL_CAPSULE | Freq: Two times a day (BID) | ORAL | 0 refills | Status: AC

## 2020-10-23 NOTE — Progress Notes (Signed)

## 2020-10-23 NOTE — Progress Notes (Signed)
I have spent 5 minutes in review of e-visit questionnaire, review and updating patient chart, medical decision making and response to patient.   Cleva Camero Cody Zyla Dascenzo, PA-C    

## 2020-12-13 ENCOUNTER — Telehealth: Payer: Self-pay

## 2020-12-13 ENCOUNTER — Ambulatory Visit: Payer: BC Managed Care – PPO | Admitting: Sports Medicine

## 2020-12-13 ENCOUNTER — Other Ambulatory Visit: Payer: Self-pay

## 2020-12-13 ENCOUNTER — Ambulatory Visit (INDEPENDENT_AMBULATORY_CARE_PROVIDER_SITE_OTHER): Payer: BC Managed Care – PPO

## 2020-12-13 DIAGNOSIS — Z09 Encounter for follow-up examination after completed treatment for conditions other than malignant neoplasm: Secondary | ICD-10-CM

## 2020-12-13 DIAGNOSIS — M6788 Other specified disorders of synovium and tendon, other site: Secondary | ICD-10-CM

## 2020-12-13 DIAGNOSIS — M1712 Unilateral primary osteoarthritis, left knee: Secondary | ICD-10-CM

## 2020-12-13 DIAGNOSIS — M7732 Calcaneal spur, left foot: Secondary | ICD-10-CM | POA: Diagnosis not present

## 2020-12-13 DIAGNOSIS — M76892 Other specified enthesopathies of left lower limb, excluding foot: Secondary | ICD-10-CM | POA: Diagnosis not present

## 2020-12-13 DIAGNOSIS — M25761 Osteophyte, right knee: Secondary | ICD-10-CM | POA: Diagnosis not present

## 2020-12-13 DIAGNOSIS — M1711 Unilateral primary osteoarthritis, right knee: Secondary | ICD-10-CM | POA: Diagnosis not present

## 2020-12-13 MED ORDER — NITROGLYCERIN 0.2 MG/HR TD PT24: MEDICATED_PATCH | TRANSDERMAL | 11 refills | Status: AC

## 2020-12-13 MED ORDER — MELOXICAM 15 MG PO TABS: ORAL_TABLET | ORAL | 3 refills | Status: AC

## 2020-12-13 NOTE — Assessment & Plan Note (Signed)
X-rays, meloxicam, formal PT. Return in a month, injection if no better.

## 2020-12-13 NOTE — Telephone Encounter (Signed)
Physician orders given to approve cutting it, and it will be on 23 hours and off 1 hour when she changes it to a new one.

## 2020-12-13 NOTE — Progress Notes (Signed)
    Procedures performed today:    None.  Independent interpretation of notes and tests performed by another provider:   None.  Brief History, Exam, Impression, and Recommendations:    Achilles tendinosis of left lower extremity This is a pleasant 55 year old female, she has a long history of pain in her left heel, posterior aspect of the Achilles insertion. Negative Thompson's test. Adding topical nitroglycerin, aggressive formal physical therapy, NSAIDs. Heel lifts. We will get some x-rays, return to see me in a month however we will probably need to do at least 6 to 8 weeks of conservative treatment before considering intervention.  Primary osteoarthritis of left knee X-rays, meloxicam, formal PT. Return in a month, injection if no better.    ___________________________________________ Ihor Austin. Benjamin Stain, M.D., ABFM., CAQSM. Primary Care and Sports Medicine [facility]  Adjunct Instructor of Family Medicine  [employer]

## 2020-12-13 NOTE — Assessment & Plan Note (Addendum)
This is a pleasant 55 year old female, she has a long history of pain in her left heel, posterior aspect of the Achilles insertion. Negative Thompson's test. Adding topical nitroglycerin, aggressive formal physical therapy, NSAIDs. Heel lifts. We will get some x-rays, return to see me in a month however we will probably need to do at least 6 to 8 weeks of conservative treatment before considering intervention.

## 2020-12-13 NOTE — Telephone Encounter (Signed)
Mandy from [employer] called with questions regarding nitroglycerin patch prescription. She states the manufacturer does not recommend cutting and you have this as the instructions for use. Also, she wants to know the specific "off time". Please advise.

## 2020-12-14 NOTE — Telephone Encounter (Signed)
Pharmacist says there are "too many red flags" to dispense this medication. She would like you to send over a clinical trial that supports the use of this medication for this diagnosis, etc.

## 2020-12-19 NOTE — Telephone Encounter (Signed)
Spoke with [facility] the pharmacist, she would prefer these be faxed to [phone].

## 2020-12-19 NOTE — Telephone Encounter (Signed)
Letter faxed with pub med ID number so that the pharmacist can review randomized controlled clinical studies supporting the use of topical nitroglycerin with Achilles tendinosis.

## 2020-12-19 NOTE — Telephone Encounter (Signed)
I have multiple clinical trials for her to review, would she like them emailed or faxed?  And if fax or email let me know a number or address.  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  PMID: [id number] PMID: [id number] PMID: [id number] PMID: [id number]

## 2020-12-22 ENCOUNTER — Encounter: Payer: Self-pay | Admitting: Physical Therapy

## 2020-12-22 ENCOUNTER — Other Ambulatory Visit: Payer: Self-pay

## 2020-12-22 ENCOUNTER — Ambulatory Visit (INDEPENDENT_AMBULATORY_CARE_PROVIDER_SITE_OTHER): Payer: BC Managed Care – PPO | Admitting: Physical Therapy

## 2020-12-22 DIAGNOSIS — R29898 Other symptoms and signs involving the musculoskeletal system: Secondary | ICD-10-CM

## 2020-12-22 DIAGNOSIS — M25562 Pain in left knee: Secondary | ICD-10-CM | POA: Diagnosis not present

## 2020-12-22 DIAGNOSIS — M25572 Pain in left ankle and joints of left foot: Secondary | ICD-10-CM | POA: Diagnosis not present

## 2020-12-22 NOTE — Patient Instructions (Signed)
Access Code: [email or web address] URL: [email or web address] Date: 12/22/2020 Prepared by: Reggy [facility]  Exercises Active Straight Leg Raise with Quad Set - 1 x daily - 7 x weekly - 3 sets - 10 reps Straight Leg Raise with External Rotation - 1 x daily - 7 x weekly - 3 sets - 10 reps Sidelying Hip Abduction - 1 x daily - 7 x weekly - 3 sets - 10 reps Clamshell - 1 x daily - 7 x weekly - 3 sets - 10 reps Single Leg Balance with Clock Reach - 1 x daily - 7 x weekly - 2 sets - 10 reps

## 2020-12-22 NOTE — Therapy (Signed)
[facility] [place] [address] [place], [place], [address] Phone: [phone]   Fax:  [phone]  Physical Therapy Evaluation  Patient Details  Name: Alexis Yu MRN: [id number] Date of Birth: 06-01-1966 Referring Provider (PT): thekkekandam   Encounter Date: 12/22/2020   PT End of Session - 12/22/20 1447    Visit Number 1    Number of Visits 6    Date for PT Re-Evaluation 02/02/21    PT Start Time 1400    PT Stop Time 1445    PT Time Calculation (min) 45 min    Activity Tolerance Patient tolerated treatment well    Behavior During Therapy [facility] for tasks assessed/performed           Past Medical History:  Diagnosis Date  . Antiphospholipid antibody positive   . Asthma   . Pneumonia     Past Surgical History:  Procedure Laterality Date  . CESAREAN SECTION  1993  . SHOULDER SURGERY Right     There were no vitals filed for this visit.    Subjective Assessment - 12/22/20 1408    Subjective Pt has Lt achilles tendonitis x 2 years which has improved but still bothers her at times with stepping on uneven surfaces or plyometric activity. Pt also with Lt knee pain that has been coming on for 2 years but worsening in the past months. Pt went to MD who prescribed meds which have helped significantly with the pain. Knee pain increases with getting up off the floor, descending stairs, squatting, ankle pain increases when "stepping funny".  Pain decreases with meds, ice, rest.    Limitations Walking;Standing    Patient Stated Goals get up off of the floor without pain, be able to hike/walk on vacation this summer    Currently in Pain? No/denies              [facility] PT Assessment - 12/22/20 0001      Assessment   Medical Diagnosis bilateral knee OA, Lt achilles tendonitis    Referring Provider (PT) [email or web address]      Balance Screen   Has the patient fallen in the past 6 months No      Prior Function   Level of  Independence Independent      Observation/Other Assessments   Focus on Therapeutic Outcomes (FOTO)  42 functional status measure      Observation/Other Assessments-Edema    Edema --   +1 edema bilat anterior/lateral ankle     Functional Tests   Functional tests Single leg stance      Single Leg Stance   Comments > 30 sec bilat      ROM / Strength   AROM / PROM / Strength AROM;Strength      AROM   AROM Assessment Site Ankle;Knee    Right/Left Knee Left;Right    Right Knee Extension 0    Right Knee Flexion 131    Left Knee Extension 0    Left Knee Flexion 123    Right/Left Ankle Left    Left Ankle Dorsiflexion 14    Left Ankle Plantar Flexion 50    Left Ankle Inversion 28    Left Ankle Eversion 11      Strength   Strength Assessment Site Knee;Ankle    Right/Left Knee Right;Left    Right Knee Flexion 4+/5    Right Knee Extension 5/5    Left Knee Flexion 4/5    Left Knee Extension 4+/5    Right/Left  Ankle Left    Left Ankle Dorsiflexion 5/5    Left Ankle Plantar Flexion 5/5    Left Ankle Inversion 4+/5    Left Ankle Eversion 4-/5      Palpation   Patella mobility no crepitus noted    Palpation comment TTP achilles insertion Lt. No TTP in heel or midfoot                      Objective measurements completed on examination: See above findings.       [facility] Adult PT Treatment/Exercise - 12/22/20 0001      Exercises   Exercises Knee/Hip      Knee/Hip Exercises: Standing   SLS SLS with clock tap x 10      Knee/Hip Exercises: Supine   Straight Leg Raises 2 sets;10 reps;Left    Straight Leg Raise with External Rotation 2 sets;Left;10 reps      Knee/Hip Exercises: Sidelying   Hip ABduction Left;2 sets;10 reps    Clams 2 x 10 Lt LE      Modalities   Modalities Iontophoresis      Iontophoresis   Type of Iontophoresis Dexamethasone    Location Lt achilles insertion    Dose 64ml    Time 4 hour patch                  PT Education -  12/22/20 1446    Education Details ionto, HEP, PT POC and goals    Person(s) Educated Patient    Methods Explanation;Demonstration;Handout    Comprehension Returned demonstration;Verbalized understanding               PT Long Term Goals - 12/22/20 1510      PT LONG TERM GOAL #1   Title Pt will be independent with HEP    Time 6    Period Weeks    Status New    Target Date 02/02/21      PT LONG TERM GOAL #2   Title Pt will improve FOTO to >= 60 to demo improved functional mobility    Time 6    Period Weeks    Status New    Target Date 02/02/21      PT LONG TERM GOAL #3   Title Pt will get onto and off of floor with pain <= 1/10 to work with decreased pain    Time 6    Period Weeks    Status New    Target Date 02/02/21                  Plan - 12/22/20 1501    Clinical Impression Statement Pt is a 55 y/o female who presents with Lt knee and ankle pain. Pt with Lt knee and hip weakness and decreased activity tolerance. Pt will benefit from skilled PT to address deficits and improve functional mobility to return to work and recreational activities with decreased pain.    Personal Factors and Comorbidities Age;Comorbidity 1;Profession    Examination-Activity Limitations Squat;Stairs;Stand;Locomotion Level    Examination-Participation Restrictions Occupation;Community Activity    Stability/Clinical Decision Making Evolving/Moderate complexity    Clinical Decision Making Moderate    PT Frequency 1x / week    PT Duration 6 weeks    PT Treatment/Interventions Iontophoresis 4mg /ml Dexamethasone;Electrical Stimulation;Cryotherapy;Aquatic Therapy;Moist Heat;Stair training;Gait training;Therapeutic activities;Therapeutic exercise;Balance training;Neuromuscular re-education;Patient/family education;Manual techniques;Taping;Dry needling;Vasopneumatic Device;Passive range of motion    PT Next Visit Plan assess HEP, progress functional activities and balance  PT Home Exercise  Plan [email or web address]    Consulted and Agree with Plan of Care Patient           Patient will benefit from skilled therapeutic intervention in order to improve the following deficits and impairments:  Decreased activity tolerance,Decreased strength,Pain,Decreased range of motion,Difficulty walking  Visit Diagnosis: Left knee pain, unspecified chronicity - Plan: PT plan of care cert/re-cert  Pain in left ankle and joints of left foot - Plan: PT plan of care cert/re-cert  Other symptoms and signs involving the musculoskeletal system - Plan: PT plan of care cert/re-cert     Problem List Patient Active Problem List   Diagnosis Date Noted  . Primary osteoarthritis of left knee 12/13/2020  . Achilles tendinosis of left lower extremity 12/13/2020  . Adhesive capsulitis of right shoulder 01/06/2017  . SLAP (superior labrum from anterior to posterior) tear 11/27/2016  . Mild intermittent asthma with acute exacerbation 06/05/2016  . Lichenoid dermatitis 12/20/2015  . Pelvic pain 01/06/2013  . Constipation, chronic 01/06/2013  . ASTHMA, NOS 04/18/2008  . HEADACHE, CHRONIC 02/24/2008  . PALPITATIONS, HX OF 02/23/2008   Ediberto Sens, PT  Elen Acero 12/22/2020, 3:33 PM  [facility] [address] [place], [place], [address] Phone: [phone]   Fax:  [phone]  Name: Alexis Yu MRN: [id number] Date of Birth: Oct 15, 1965

## 2020-12-28 ENCOUNTER — Telehealth: Payer: BC Managed Care – PPO | Admitting: Physician Assistant

## 2020-12-28 DIAGNOSIS — R0789 Other chest pain: Secondary | ICD-10-CM | POA: Diagnosis not present

## 2020-12-28 DIAGNOSIS — J069 Acute upper respiratory infection, unspecified: Secondary | ICD-10-CM

## 2020-12-28 MED ORDER — ALBUTEROL SULFATE HFA 108 (90 BASE) MCG/ACT IN AERS: 2.0000 | INHALATION_SPRAY | Freq: Four times a day (QID) | RESPIRATORY_TRACT | 1 refills | Status: DC | PRN

## 2020-12-28 MED ORDER — BENZONATATE 100 MG PO CAPS: 100.0000 mg | ORAL_CAPSULE | Freq: Three times a day (TID) | ORAL | 0 refills | Status: DC | PRN

## 2020-12-28 NOTE — Progress Notes (Signed)
We are sorry you are not feeling well.  Here is how we plan to help!  Based on what you have shared with me, it looks like you may have a viral upper respiratory infection with mild increase in asthma symptoms. Upper respiratory infections are caused by a large number of viruses; however, rhinovirus is the most common cause. Giving how quickly this developed and with chest tightness, you need to be tested for COVID and quarantine until results are in. You can go to your local [facility] for testing or go to [email or web address] to get set up for testing through [facility].  Symptoms vary from person to person, with common symptoms including sore throat, cough, fatigue or lack of energy and feeling of general discomfort.  A low-grade fever of up to 100.4 may present, but is often uncommon.  Symptoms vary however, and are closely related to a person's age or underlying illnesses.  The most common symptoms associated with an upper respiratory infection are nasal discharge or congestion, cough, sneezing, headache and pressure in the ears and face.  These symptoms usually persist for about 3 to 10 days, but can last up to 2 weeks.  It is important to know that upper respiratory infections do not cause serious illness or complications in most cases.    Upper respiratory infections can be transmitted from person to person, with the most common method of transmission being a person's hands.  The virus is able to live on the skin and can infect other persons for up to 2 hours after direct contact.  Also, these can be transmitted when someone coughs or sneezes; thus, it is important to cover the mouth to reduce this risk.  To keep the spread of the illness at bay, good hand hygiene is very important.  This is an infection that is most likely caused by a virus. There are no specific treatments other than to help you with the symptoms until the infection runs its course.  We are sorry you are not feeling  well.  Here is how we plan to help!   For nasal congestion, you may use an oral decongestants such as Mucinex D or if you have glaucoma or high blood pressure use plain Mucinex.  Saline nasal spray or nasal drops can help and can safely be used as often as needed for congestion.   If you do not have a history of heart disease, hypertension, diabetes or thyroid disease, prostate/bladder issues or glaucoma, you may also use Sudafed to treat nasal congestion.  It is highly recommended that you consult with a pharmacist or your primary care physician to ensure this medication is safe for you to take.     If you have a cough, you may use cough suppressants such as Delsym and Robitussin.  If you have glaucoma or high blood pressure, you can also use Coricidin HBP.   For cough I have prescribed for you A prescription cough medication called Tessalon Perles 100 mg. You may take 1-2 capsules every 8 hours as needed for cough   I have also sent in a refill of your albuterol inhaler to use as directed if needed.  If you have a sore or scratchy throat, use a saltwater gargle-  to  teaspoon of salt dissolved in a 4-ounce to 8-ounce glass of warm water.  Gargle the solution for approximately 15-30 seconds and then spit.  It is important not to swallow the solution.  You can also use throat lozenges/cough drops and  Chloraseptic spray to help with throat pain or discomfort.  Warm or cold liquids can also be helpful in relieving throat pain.  For headache, pain or general discomfort, you can use Ibuprofen or Tylenol as directed.   Some authorities believe that zinc sprays or the use of Echinacea may shorten the course of your symptoms.   HOME CARE . Only take medications as instructed by your medical team. . Be sure to drink plenty of fluids. Water is fine as well as fruit juices, sodas and electrolyte beverages. You may want to stay away from caffeine or alcohol. If you are nauseated, try taking small sips of  liquids. How do you know if you are getting enough fluid? Your urine should be a pale yellow or almost colorless. . Get rest. . Taking a steamy shower or using a humidifier may help nasal congestion and ease sore throat pain. You can place a towel over your head and breathe in the steam from hot water coming from a faucet. . Using a saline nasal spray works much the same way. . Cough drops, hard candies and sore throat lozenges may ease your cough. . Avoid close contacts especially the very young and the elderly . Cover your mouth if you cough or sneeze . Always remember to wash your hands.   GET HELP RIGHT AWAY IF: . You develop worsening fever. . If your symptoms do not improve within 10 days . You develop yellow or green discharge from your nose over 3 days. . You have coughing fits . You develop a severe head ache or visual changes. . You develop shortness of breath, difficulty breathing or start having chest pain . Your symptoms persist after you have completed your treatment plan  MAKE SURE YOU   Understand these instructions.  Will watch your condition.  Will get help right away if you are not doing well or get worse.  Your e-visit answers were reviewed by a board certified advanced clinical practitioner to complete your personal care plan. Depending upon the condition, your plan could have included both over the counter or prescription medications. Please review your pharmacy choice. If there is a problem, you may call our nursing hot line at and have the prescription routed to another pharmacy. Your safety is important to [place]. If you have drug allergies check your prescription carefully.   You can use MyChart to ask questions about today's visit, request a non-urgent call back, or ask for a work or school excuse for 24 hours related to this e-Visit. If it has been greater than 24 hours you will need to follow up with your provider, or enter a new e-Visit to address those  concerns. You will get an e-mail in the next two days asking about your experience.  I hope that your e-visit has been valuable and will speed your recovery. Thank you for using e-visits.

## 2020-12-28 NOTE — Progress Notes (Signed)
I have spent 5 minutes in review of e-visit questionnaire, review and updating patient chart, medical decision making and response to patient.   Nashanti Duquette Cody Angela Platner, PA-C    

## 2020-12-29 ENCOUNTER — Encounter: Payer: BC Managed Care – PPO | Admitting: Physical Therapy

## 2020-12-30 ENCOUNTER — Encounter: Payer: Self-pay | Admitting: Osteopathic Medicine

## 2021-01-01 ENCOUNTER — Telehealth: Payer: BC Managed Care – PPO | Admitting: Family Medicine

## 2021-01-01 ENCOUNTER — Encounter: Payer: Self-pay | Admitting: Family Medicine

## 2021-01-01 DIAGNOSIS — U071 COVID-19: Secondary | ICD-10-CM | POA: Diagnosis not present

## 2021-01-01 MED ORDER — PREDNISONE 20 MG PO TABS: 40.0000 mg | ORAL_TABLET | Freq: Every day | ORAL | 0 refills | Status: AC

## 2021-01-01 NOTE — Progress Notes (Signed)
Virtual Video Visit via MyChart Note converted to telephone d/t poor connection  I connected with  Alexis Yu on 01/01/21 at  1:00 PM EDT by the video enabled telemedicine application for MyChart, and verified that I am speaking with the correct person using two identifiers.   I introduced myself as a Publishing rights manager with the practice. We discussed the limitations of evaluation and management by telemedicine and the availability of in person appointments. The patient expressed understanding and agreed to proceed.  Participating parties in this visit include: The patient and the nurse practitioner listed. The patient is: At home I am: In the office - Primary Care [facility]  Subjective:    CC:  Chief Complaint  Patient presents with  . URI    HPI: Alexis Yu is a 55 y.o. year old female presenting today via MyChart today for COVID.  Patient tested positive for COVID last week. Reports she had been around a coworker who was sick earlier in the week but coworker tested negative. Patient's symptoms started on Wednesday with chest tightness (history of asthma). Symptoms gradually progressed and she had an e-visit on Thursday to request another albuterol inhaler. By Thursday night and throughout the weekend she had severe body aches causing her to stay in the bed, fever (mas 102 F), nasal congestion, productive cough, and significant sinus/ear pressure. She denies any wheezing or difficulty breathing, chest pain, vomiting, diarrhea. She has been taking Afrin nasal spray, Mucinex and tylenol/ibprofen with some improvement. Reports her last fever was yesterday and the body aches seem to be improving, she was able to get out of bed today; however, the sinus and ear pressure remain significant. She has been checking her pulse ox and it is remaining >92%.   Vaccinated in 2021 (J&J x2).    Past medical history, Surgical history, Family history not pertinant except as noted below, Social history,  Allergies, and medications have been entered into the medical record, reviewed, and corrections made.   Review of Systems:  All review of systems negative except what is listed in the HPI   Objective:    General:  Speaking clearly in complete sentences. Absent shortness of breath noted.   Alert and oriented x3.   Normal judgment.  Absent acute distress.   Impression and Recommendations:    1. COVID Patient on day 5 of COVID infection with sinus/ear pressure becoming her only symptom not starting to improve. We discussed COVID virus vs secondary infections and agreed to start with a prednisone burst and continue supportive measures for a few more days. If not improving by Friday or if significantly worse before then, will send in an antibiotic before the weekend. Handout attached to AVS with home management and quarantine recommendations. Patient aware of signs and symptoms requiring urgent evaluation.   - predniSONE (DELTASONE) 20 MG tablet; Take 2 tablets (40 mg total) by mouth daily with breakfast for 5 days.  Dispense: 10 tablet; Refill: 0   Follow-up if symptoms worsen or fail to improve.    I discussed the assessment and treatment plan with the patient. The patient was provided an opportunity to ask questions and all were answered. The patient agreed with the plan and demonstrated an understanding of the instructions.   The patient was advised to call back or seek an in-person evaluation if the symptoms worsen or if the condition fails to improve as anticipated.  I provided 20 minutes of non-face-to-face interaction with this MYCHART visit including intake, same-day documentation, and chart  review.   Terrilyn Saver, NP

## 2021-01-01 NOTE — Patient Instructions (Signed)
Over the counter medications that may be helpful for symptoms:  . Guaifenesin 1200 mg extended release tabs twice daily, with plenty of water o For cough and congestion o Brand name: Mucinex   . Pseudoephedrine 30 mg, one or two tabs every 4 to 6 hours o For sinus congestion o Brand name: Sudafed o You must get this from the pharmacy counter.  . Oxymetazoline nasal spray each morning, one spray in each nostril, for NO MORE THAN 3 days  o For nasal and sinus congestion o Brand name: Afrin . Saline nasal spray or Saline Nasal Irrigation 3-5 times a day o For nasal and sinus congestion o Brand names: Ocean or AYR . Fluticasone nasal spray, one spray in each nostril, each morning (after oxymetazoline and saline, if used) o For nasal and sinus congestion o Brand name: Flonase . Warm salt water gargles  o For sore throat o Every few hours as needed . Alternate ibuprofen 400-600 mg and acetaminophen 1000 mg every 4-6 hours o For fever, body aches, headache o Brand names: Motrin or Advil and Tylenol . Dextromethorphan 12-hour cough version 30 mg every 12 hours  o For cough o Brand name: Delsym Stop all other cold medications for now (Nyquil, Dayquil, Tylenol Cold, Theraflu, etc) and other non-prescription cough/cold preparations. Many of these have the same ingredients listed above and could cause an overdose of medication.   Herbal treatments that have been shown to be helpful in some patients include: Vitamin C 1000mg per day Vitamin D 4000iU per day Zinc 100mg per day Quercetin 25-500mg twice a day Melatonin 5-10mg at bedtime  General Instructions . Allow your body to rest . Drink PLENTY of fluids . Isolate yourself from everyone, even family, until test results have returned  If your COVID-19 test is positive . Then you ARE INFECTED and you can pass the virus to others . You must quarantine from others for a minimum of  o 10 days since symptoms started AND o You are fever  free for 24 hours WITHOUT any medication to reduce fever AND o Your symptoms are improving . Do not go to the store or other public areas . Do not go around household members who are not known to be infected with COVID-19 . If you MUST leave your area of quarantine (example: go to a bathroom you share with others in your home), you must o Wear a mask o Wash your hands thoroughly o Wipe down any surfaces you touch . Do not share food, drinks, towels, or other items with other persons . Dispose of your own tissues, food containers, etc  Once you have recovered, please continue good preventive care measures, including:  . wearing a mask when in public . wash your hands frequently . avoid touching your face/nose/eyes . cover coughs/sneezes with the inside of your elbow . stay out of crowds . keep a 6 foot distance from others  If you develop severe shortness of breath, uncontrolled fevers, coughing up blood, confusion, chest pain, or signs of dehydration (such as significantly decreased urine amounts or dizziness with standing) please go to the ER.  

## 2021-01-05 ENCOUNTER — Other Ambulatory Visit: Payer: Self-pay

## 2021-01-05 ENCOUNTER — Ambulatory Visit (INDEPENDENT_AMBULATORY_CARE_PROVIDER_SITE_OTHER): Payer: BC Managed Care – PPO | Admitting: Physical Therapy

## 2021-01-05 DIAGNOSIS — M25562 Pain in left knee: Secondary | ICD-10-CM

## 2021-01-05 DIAGNOSIS — R29898 Other symptoms and signs involving the musculoskeletal system: Secondary | ICD-10-CM | POA: Diagnosis not present

## 2021-01-05 DIAGNOSIS — M25572 Pain in left ankle and joints of left foot: Secondary | ICD-10-CM

## 2021-01-05 NOTE — Therapy (Addendum)
[facility] [address] [place], [place], [address] Phone: [phone]   Fax:  [phone]  Physical Therapy Treatment and Discharge  Patient Details  Name: Alexis Yu MRN: [id number] Date of Birth: [birth year] Referring Provider (PT): [email or web address]   Encounter Date: 01/05/2021   PT End of Session - 01/05/21 1440    Visit Number 2    Number of Visits 6    Date for PT Re-Evaluation 02/02/21    PT Start Time 1400    PT Stop Time 1441    PT Time Calculation (min) 41 min    Activity Tolerance Patient tolerated treatment well    Behavior During Therapy [facility] for tasks assessed/performed           Past Medical History:  Diagnosis Date  . Antiphospholipid antibody positive   . Asthma   . Pneumonia     Past Surgical History:  Procedure Laterality Date  . CESAREAN SECTION  1993  . SHOULDER SURGERY Right     There were no vitals filed for this visit.   Subjective Assessment - 01/05/21 1404    Subjective Pt reports she has just recovered from Covid and came off of quarantine yesterday. states both her knee and ankle feel better due to the prolonged rest    Patient Stated Goals get up off of the floor without pain, be able to hike/walk on vacation this summer    Currently in Pain? No/denies                             [facility] Adult PT Treatment/Exercise - 01/05/21 0001      Knee/Hip Exercises: Aerobic   Nustep x 5 minutes level 5      Knee/Hip Exercises: Machines for Strengthening   Total Gym Leg Press 7 plates 2 x 10 bilat LE, single LE 4 plates 2 x 10      Knee/Hip Exercises: Standing   Step Down Left;2 sets;10 reps;Hand Hold: 1;Step Height: 6"    SLS SLS with clock tap x 10    Other Standing Knee Exercises squats on various levels of foam with focus on knee alignment      Knee/Hip Exercises: Supine   Straight Leg Raises 2 sets;10 reps    Straight Leg Raises Limitations 2#    Straight Leg  Raise with External Rotation Left;2 sets;10 reps    Straight Leg Raise with External Rotation Limitations 2#      Knee/Hip Exercises: Sidelying   Hip ABduction Left;2 sets;10 reps    Clams 2 x 10 Lt LE                  PT Education - 01/05/21 1440    Education Details educated on safe activities to perform at the gym    Person(s) Educated Patient    Methods Explanation    Comprehension Verbalized understanding               PT Long Term Goals - 12/22/20 1510      PT LONG TERM GOAL #1   Title Pt will be independent with HEP    Time 6    Period Weeks    Status New    Target Date 02/02/21      PT LONG TERM GOAL #2   Title Pt will improve FOTO to >= 60 to demo improved functional mobility    Time 6    Period Weeks  Status New    Target Date 02/02/21      PT LONG TERM GOAL #3   Title Pt will get onto and off of floor with pain <= 1/10 to work with decreased pain    Time 6    Period Weeks    Status New    Target Date 02/02/21                 Plan - 01/05/21 1441    Clinical Impression Statement Pt's progress limited by covid diagnosis leaving pt with decreased strength. Pt with decreased pain this session and responds well to progression of exercises. continues with Lt knee pain with eccentric strengthening    PT Next Visit Plan progress functional activities and balance    PT Home Exercise Plan [id number]    Consulted and Agree with Plan of Care Patient           Patient will benefit from skilled therapeutic intervention in order to improve the following deficits and impairments:     Visit Diagnosis: Pain in left ankle and joints of left foot  Left knee pain, unspecified chronicity  Other symptoms and signs involving the musculoskeletal system     Problem List Patient Active Problem List   Diagnosis Date Noted  . Primary osteoarthritis of left knee 12/13/2020  . Achilles tendinosis of left lower extremity 12/13/2020  . Adhesive  capsulitis of right shoulder 01/06/2017  . SLAP (superior labrum from anterior to posterior) tear 11/27/2016  . Mild intermittent asthma with acute exacerbation 06/05/2016  . Lichenoid dermatitis 12/20/2015  . Pelvic pain 01/06/2013  . Constipation, chronic 01/06/2013  . ASTHMA, NOS 04/18/2008  . HEADACHE, CHRONIC 02/24/2008  . PALPITATIONS, HX OF 02/23/2008  PHYSICAL THERAPY DISCHARGE SUMMARY  Visits from Start of Care: 2  Current functional level related to goals / functional outcomes: Decreased pain and improving mobility   Remaining deficits: See above   Education / Equipment: HEP Plan: Patient agrees to discharge.  Patient goals were not met. Patient is being discharged due to not returning since the last visit.  ?????     Alexis Yu, PT,DPT06/08/2210:13 PM  Alexis Yu, PT  Alexis Yu 01/05/2021, 2:43 PM  [facility] [place], [place], [address] Phone: [phone]   Fax:  [phone]  Name: Alexis Yu MRN: [id number] Date of Birth: 05/12/1966

## 2021-01-10 ENCOUNTER — Other Ambulatory Visit: Payer: Self-pay

## 2021-01-10 ENCOUNTER — Ambulatory Visit (INDEPENDENT_AMBULATORY_CARE_PROVIDER_SITE_OTHER): Payer: BC Managed Care – PPO | Admitting: Sports Medicine

## 2021-01-10 DIAGNOSIS — M1712 Unilateral primary osteoarthritis, left knee: Secondary | ICD-10-CM | POA: Diagnosis not present

## 2021-01-10 DIAGNOSIS — M6788 Other specified disorders of synovium and tendon, other site: Secondary | ICD-10-CM | POA: Diagnosis not present

## 2021-01-10 NOTE — Assessment & Plan Note (Signed)
Once we were finally able to get her the Nitropatch as her symptoms improved considerably, she has been having a headache that really seems to be more related to her recent COVID, but I would like her to go ahead and stop the patches for a week and see if that helps. If no improvement she will continue them for a full 12 weeks. She was given rehab exercises at the last visit.

## 2021-01-10 NOTE — Assessment & Plan Note (Signed)
Knee osteoarthritis much better with meloxicam, return as needed.

## 2021-01-10 NOTE — Progress Notes (Signed)
    Procedures performed today:    None.  Independent interpretation of notes and tests performed by another provider:   None.  Brief History, Exam, Impression, and Recommendations:    Primary osteoarthritis of left knee Knee osteoarthritis much better with meloxicam, return as needed.  Achilles tendinosis of left lower extremity Once we were finally able to get her the Nitropatch as her symptoms improved considerably, she has been having a headache that really seems to be more related to her recent COVID, but I would like her to go ahead and stop the patches for a week and see if that helps. If no improvement she will continue them for a full 12 weeks. She was given rehab exercises at the last visit.    ___________________________________________ Ihor Austin. Benjamin Stain, M.D., ABFM., CAQSM. Primary Care and Sports Medicine [facility]  Adjunct Instructor of Family Medicine  [employer]

## 2021-11-19 ENCOUNTER — Telehealth: Payer: BC Managed Care – PPO | Admitting: Physician Assistant

## 2021-11-19 DIAGNOSIS — J019 Acute sinusitis, unspecified: Secondary | ICD-10-CM

## 2021-11-19 DIAGNOSIS — J4521 Mild intermittent asthma with (acute) exacerbation: Secondary | ICD-10-CM | POA: Diagnosis not present

## 2021-11-19 DIAGNOSIS — B9789 Other viral agents as the cause of diseases classified elsewhere: Secondary | ICD-10-CM | POA: Diagnosis not present

## 2021-11-19 MED ORDER — ALBUTEROL SULFATE HFA 108 (90 BASE) MCG/ACT IN AERS: 2.0000 | INHALATION_SPRAY | Freq: Four times a day (QID) | RESPIRATORY_TRACT | 0 refills | Status: AC | PRN

## 2021-11-19 MED ORDER — BENZONATATE 100 MG PO CAPS: 100.0000 mg | ORAL_CAPSULE | Freq: Three times a day (TID) | ORAL | 0 refills | Status: AC | PRN

## 2021-11-19 MED ORDER — PREDNISONE 10 MG (21) PO TBPK: ORAL_TABLET | ORAL | 0 refills | Status: DC

## 2021-11-19 MED ORDER — IPRATROPIUM BROMIDE 0.03 % NA SOLN: 2.0000 | Freq: Two times a day (BID) | NASAL | 0 refills | Status: AC

## 2021-11-19 NOTE — Progress Notes (Signed)
We are sorry that you are not feeling well.  Here is how we plan to help! ? ?Based on your presentation I believe you most likely have A cough due to a virus.  This is called viral bronchitis and is best treated by rest, plenty of fluids and control of the cough.  You may use Ibuprofen or Tylenol as directed to help your symptoms.   ?  ?In addition you may use A non-prescription cough medication called Mucinex DM: take 2 tablets every 12 hours. and A prescription cough medication called Tessalon Perles 100mg . You may take 1-2 capsules every 8 hours as needed for your cough. ? ?Prednisone 10 mg daily for 6 days (see taper instructions below) ? ?Directions for 6 day taper: ?Day 1: 2 tablets before breakfast, 1 after both lunch & dinner and 2 at bedtime ?Day 2: 1 tab before breakfast, 1 after both lunch & dinner and 2 at bedtime ?Day 3: 1 tab at each meal & 1 at bedtime ?Day 4: 1 tab at breakfast, 1 at lunch, 1 at bedtime ?Day 5: 1 tab at breakfast & 1 tab at bedtime ?Day 6: 1 tab at breakfast ? ?I will refill Albuterol inhaler to make sure you have enough. ? ?I will also prescribe Ipratropium Bromide nasal spray for the sinus/nasal congestion and drainage. Use 2 sprays in each nostril twice daily for 5-7 days.  ? ?From your responses in the eVisit questionnaire you describe inflammation in the upper respiratory tract which is causing a significant cough.  This is commonly called Bronchitis and has four common causes:   ?Allergies ?Viral Infections ?Acid Reflux ?Bacterial Infection ?Allergies, viruses and acid reflux are treated by controlling symptoms or eliminating the cause. An example might be a cough caused by taking certain blood pressure medications. You stop the cough by changing the medication. Another example might be a cough caused by acid reflux. Controlling the reflux helps control the cough. ? ?USE OF BRONCHODILATOR ("RESCUE") INHALERS: ?There is a risk from using your bronchodilator too frequently.  The  risk is that over-reliance on a medication which only relaxes the muscles surrounding the breathing tubes can reduce the effectiveness of medications prescribed to reduce swelling and congestion of the tubes themselves.  Although you feel brief relief from the bronchodilator inhaler, your asthma may actually be worsening with the tubes becoming more swollen and filled with mucus.  This can delay other crucial treatments, such as oral steroid medications. If you need to use a bronchodilator inhaler daily, several times per day, you should discuss this with your provider.  There are probably better treatments that could be used to keep your asthma under control.  ?   ?HOME CARE ?Only take medications as instructed by your medical team. ?Complete the entire course of an antibiotic. ?Drink plenty of fluids and get plenty of rest. ?Avoid close contacts especially the very young and the elderly ?Cover your mouth if you cough or cough into your sleeve. ?Always remember to wash your hands ?A steam or ultrasonic humidifier can help congestion.  ? ?GET HELP RIGHT AWAY IF: ?You develop worsening fever. ?You become short of breath ?You cough up blood. ?Your symptoms persist after you have completed your treatment plan ?MAKE SURE YOU  ?Understand these instructions. ?Will watch your condition. ?Will get help right away if you are not doing well or get worse. ?  ? ?Thank you for choosing an e-visit. ? ?Your e-visit answers were reviewed by a board certified advanced  clinical practitioner to complete your personal care plan. Depending upon the condition, your plan could have included both over the counter or prescription medications. ? ?Please review your pharmacy choice. Make sure the pharmacy is open so you can pick up prescription now. If there is a problem, you may contact your provider through [employer] and have the prescription routed to another pharmacy.  Your safety is important to [place]. If you have drug allergies  check your prescription carefully.  ? ?For the next 24 hours you can use MyChart to ask questions about today's visit, request a non-urgent call back, or ask for a work or school excuse. ?You will get an email in the next two days asking about your experience. I hope that your e-visit has been valuable and will speed your recovery. ? ? ?I provided 5 minutes of non face-to-face time during this encounter for chart review and documentation.  ? ?

## 2021-11-27 ENCOUNTER — Emergency Department: Admit: 2021-11-27 | Discharge status: Absent without leave | Payer: Self-pay

## 2021-11-27 ENCOUNTER — Telehealth: Payer: BC Managed Care – PPO | Admitting: Physician Assistant

## 2021-11-27 ENCOUNTER — Emergency Department
Admission: EM | Admit: 2021-11-27 | Discharge: 2021-11-27 | Disposition: A | Discharge status: Home / Self Care | Payer: BC Managed Care – PPO | Source: Home / Self Care

## 2021-11-27 DIAGNOSIS — J0100 Acute maxillary sinusitis, unspecified: Secondary | ICD-10-CM

## 2021-11-27 DIAGNOSIS — R0602 Shortness of breath: Secondary | ICD-10-CM

## 2021-11-27 DIAGNOSIS — J309 Allergic rhinitis, unspecified: Secondary | ICD-10-CM

## 2021-11-27 DIAGNOSIS — B9689 Other specified bacterial agents as the cause of diseases classified elsewhere: Secondary | ICD-10-CM

## 2021-11-27 DIAGNOSIS — J3489 Other specified disorders of nose and nasal sinuses: Secondary | ICD-10-CM | POA: Diagnosis not present

## 2021-11-27 MED ORDER — FEXOFENADINE HCL 180 MG PO TABS: 180.0000 mg | ORAL_TABLET | Freq: Every day | ORAL | 0 refills | Status: AC

## 2021-11-27 MED ORDER — AMOXICILLIN-POT CLAVULANATE 875-125 MG PO TABS: 1.0000 | ORAL_TABLET | Freq: Two times a day (BID) | ORAL | 0 refills | Status: AC

## 2021-11-27 MED ORDER — PREDNISONE 20 MG PO TABS: ORAL_TABLET | ORAL | 0 refills | Status: AC

## 2021-11-27 NOTE — ED Triage Notes (Signed)
Pt c/o cough, congestion and bilateral ear pain x 10 days. E visit on 3/27, tx with steroids and tessalon. Finished course but no improvement. Crackling in chest when laying down. Sinus and ear pain worsening in last 24 hours. Taking mucinex and nasal spray prn. Hx of asthma. (Using inhaler more) ?

## 2021-11-27 NOTE — Progress Notes (Signed)
Based on what you shared with me, I feel your condition warrants further evaluation and I recommend that you be seen in a face to face visit. ? ?Giving continued shortness of breath despite steroid given you will need to be evaluated in person for a detailed lung examination, possible nebulizer treatments and imaging to make sure no concern for pneumonia as well -- so most appropriate antibiotics can be started.  ?  ?NOTE: There will be NO CHARGE for this eVisit ?  ?If you are having a true medical emergency please call 911.   ?  ? For an urgent face to face visit, [employer] has six urgent care centers for your convenience:  ?  ? [facility] ?Get Driving Directions ?[phone] ?[phone] ?[place], [place] [address] ?  ? [facility]) ?Get Driving Directions ?[phone] ?[address] ?[place], [place] [address] ? ?[facility]) ?Get Driving Directions ?[phone] ?[address] ?[place],  [place]  [address] ? ?[facility] ?Get Driving Directions ?[phone] ?[address] ?[place], [place] [address] ?  ?[facility] [place] ?Get Driving Directions  ?[phone] ?[address] ?[address] ?[place], [place] [address] ?  ?[facility] ?Get Driving Directions ?[phone] ?[address] ?[place], [place] [address] ? ?Your MyChart E-visit questionnaire answers were reviewed by a board certified advanced clinical practitioner to complete your personal care plan based on your specific symptoms.  Thank you for using e-Visits. ?  ? ?

## 2021-11-27 NOTE — Discharge Instructions (Addendum)
Advised patient to take medication as directed with food to completion.  Advised patient to take prednisone and Allegra with first dose of Augmentin for the next 5 of 10 days.  Advised may use Allegra as needed afterwards for concurrent postnasal drip/drainage.  Encouraged patient to increase daily water intake while taking these medications.  Advised patient if symptoms worsen and/or unresolved please follow-up with PCP or here for further evaluation. ?

## 2021-11-27 NOTE — ED Provider Notes (Signed)
?[facility] ? ? ? ?CSN: [id number] ?Arrival date & time: 11/27/21  1604 ? ? ?  ? ?History   ?Chief Complaint ?Chief Complaint  ?Patient presents with  ? Cough  ? Nasal Congestion  ? Otalgia  ?  Bilateral  ? ? ?HPI ?Alexis Yu is a 56 y.o. female.  ? ?HPI 56 year old female presents with cough, congestion and bilateral ear pain for 10 days.  Reports E-visit on 11/19/2021 was treated with steroids, ipratropium bromide, albuterol sulfate and Tessalon (treated for asthma exacerbation by PCP).  Patient reports sinus and ear pain worsening in the past 24 hours.  Patient reports taking OTC Mucinex and nasal spray as needed.  PMH significant for asthma. ? ?Past Medical History:  ?Diagnosis Date  ? Antiphospholipid antibody positive   ? Asthma   ? Pneumonia   ? ? ?Patient Active Problem List  ? Diagnosis Date Noted  ? Primary osteoarthritis of left knee 12/13/2020  ? Achilles tendinosis of left lower extremity 12/13/2020  ? Adhesive capsulitis of right shoulder 01/06/2017  ? SLAP (superior labrum from anterior to posterior) tear 11/27/2016  ? Mild intermittent asthma with acute exacerbation 06/05/2016  ? Lichenoid dermatitis 12/20/2015  ? Pelvic pain 01/06/2013  ? Constipation, chronic 01/06/2013  ? ASTHMA, NOS 04/18/2008  ? HEADACHE, CHRONIC 02/24/2008  ? PALPITATIONS, HX OF 02/23/2008  ? ? ?Past Surgical History:  ?Procedure Laterality Date  ? CESAREAN SECTION  1993  ? SHOULDER SURGERY Right   ? ? ?OB History   ? ? Gravida  ?3  ? Para  ?2  ? Term  ?1  ? Preterm  ?1  ? AB  ?1  ? Living  ?2  ?  ? ? SAB  ?1  ? IAB  ?   ? Ectopic  ?   ? Multiple  ?   ? Live Births  ?   ?   ?  ?  ? ? ? ?Home Medications   ? ?Prior to Admission medications   ?Medication Sig Start Date End Date Taking? Authorizing Provider  ?amoxicillin-clavulanate (AUGMENTIN) 875-125 MG tablet Take 1 tablet by mouth 2 (two) times daily for 10 days. 11/27/21 12/07/21 Yes Trevor Iha, FNP  ?fexofenadine (ALLEGRA ALLERGY) 180 MG tablet Take 1 tablet (180  mg total) by mouth daily for 15 days. 11/27/21 12/12/21 Yes Trevor Iha, FNP  ?predniSONE (DELTASONE) 20 MG tablet Take 3 tabs PO daily x 5 days. 11/27/21  Yes Trevor Iha, FNP  ?albuterol (VENTOLIN HFA) 108 (90 Base) MCG/ACT inhaler Inhale 2 puffs into the lungs every 6 (six) hours as needed for wheezing or shortness of breath. 11/19/21   Margaretann Loveless, PA-C  ?benzonatate (TESSALON) 100 MG capsule Take 1 capsule (100 mg total) by mouth 3 (three) times daily as needed. 11/19/21   Margaretann Loveless, PA-C  ?Biotin 1 MG CAPS Take by mouth.    [provider]  ?ELDERBERRY PO Take by mouth.    [provider]  ?Estradiol 10 MCG TABS vaginal tablet Place 1 tablet (10 mcg total) vaginally 2 (two) times a week. 08/24/18   Lesly Dukes, MD  ?ipratropium (ATROVENT) 0.03 % nasal spray Place 2 sprays into both nostrils every 12 (twelve) hours. 11/19/21   Margaretann Loveless, PA-C  ?Liniments (DEEP BLUE RELIEF EX) Apply topically.    [provider]  ?meloxicam (MOBIC) 15 MG tablet One tab PO qAM with a meal for 2 weeks, then daily prn pain. 12/13/20   Rodney Langton  J, MD  ?nitroGLYCERIN (NITRODUR - DOSED IN MG/24 HR) 0.2 mg/hr patch Cut and apply 1/4 patch to most painful area q24h. 12/13/20   Monica Becton, MD  ?Probiotic Product (PROBIOTIC-10 PO) Take by mouth.    [provider]  ?Spacer/Aero-Holding Chambers (E-Z SPACER) inhaler Use as instructed 06/05/16   Sunnie Nielsen, DO  ? ? ?Family History ?Family History  ?Problem Relation Age of Onset  ? Diabetes Father   ? Stroke Father   ? Stroke Mother   ? Heart disease Paternal Grandfather   ? Heart disease Maternal Grandmother   ? Stroke Maternal Grandmother   ? Depression Sister   ? Cancer Paternal Uncle   ?     ESOPHOGIAL  ? ? ?Social History ?Social History  ? ?Tobacco Use  ? Smoking status: Never  ? Smokeless tobacco: Never  ?Substance Use Topics  ? Alcohol use: Yes  ?  Comment: occassional  ? Drug use: No   ? ? ? ?Allergies   ?Patient has no known allergies. ? ? ?Review of Systems ?Review of Systems  ?HENT:  Positive for ear pain, sinus pressure and sore throat.   ?Respiratory:  Positive for cough.   ?All other systems reviewed and are negative. ? ? ?Physical Exam ?Triage Vital Signs ?ED Triage Vitals  ?Enc Vitals Group  ?   BP 11/27/21 1636 (!) 144/84  ?   Pulse Rate 11/27/21 1636 (!) 110  ?   Resp 11/27/21 1636 17  ?   Temp 11/27/21 1636 98.1 ?F (36.7 ?C)  ?   Temp Source 11/27/21 1636 Oral  ?   SpO2 11/27/21 1636 95 %  ?   Weight --   ?   Height --   ?   Head Circumference --   ?   Peak Flow --   ?   Pain Score 11/27/21 1638 9  ?   Pain Loc --   ?   Pain Edu? --   ?   Excl. in GC? --   ? ?No data found. ? ?Updated Vital Signs ?BP (!) 144/84 (BP Location: Right Arm)   Pulse (!) 110   Temp 98.1 ?F (36.7 ?C) (Oral)   Resp 17   LMP 12/30/2012   SpO2 95%  ? ? ?Physical Exam ?Vitals and nursing note reviewed.  ?Constitutional:   ?   Appearance: Normal appearance. She is obese.  ?HENT:  ?   Head: Normocephalic and atraumatic.  ?   Right Ear: Tympanic membrane and external ear normal.  ?   Left Ear: Tympanic membrane and external ear normal.  ?   Ears:  ?   Comments: Moderate to significant eustachian tube dysfunction noted bilaterally ?   Nose:  ?   Right Sinus: Maxillary sinus tenderness present.  ?   Left Sinus: Maxillary sinus tenderness present.  ?   Comments: Turbinates are erythematous/edematous ?   Mouth/Throat:  ?   Mouth: Mucous membranes are moist.  ?   Pharynx: Oropharynx is clear.  ?   Comments: Moderate to significant amount of clear drainage of posterior oropharynx noted ?Eyes:  ?   Extraocular Movements: Extraocular movements intact.  ?   Conjunctiva/sclera: Conjunctivae normal.  ?   Pupils: Pupils are equal, round, and reactive to light.  ?Cardiovascular:  ?   Rate and Rhythm: Normal rate and regular rhythm.  ?   Pulses: Normal pulses.  ?   Heart sounds: Normal heart sounds.  ?Pulmonary:  ?   Effort:  Pulmonary effort is normal.  ?   Breath sounds: Normal breath sounds. No wheezing, rhonchi or rales.  ?Musculoskeletal:  ?   Cervical back: Normal range of motion and neck supple.  ?Skin: ?   General: Skin is warm and dry.  ?Neurological:  ?   General: No focal deficit present.  ?   Mental Status: She is alert and oriented to person, place, and time.  ? ? ? ?UC Treatments / Results  ?Labs ?(all labs ordered are listed, but only abnormal results are displayed) ?Labs Reviewed - No data to display ? ?EKG ? ? ?Radiology ?No results found. ? ?Procedures ?Procedures (including critical care time) ? ?Medications Ordered in UC ?Medications - No data to display ? ?Initial Impression / Assessment and Plan / UC Course  ?I have reviewed the triage vital signs and the nursing notes. ? ?Pertinent labs & imaging results that were available during my care of the patient were reviewed by me and considered in my medical decision making (see chart for details). ? ?  ? ?MDM: 1.  Acute maxillary sinusitis, recurrence not specified-Rx'd Augmentin; 2.  Sinus pressure-prednisone; 3.  Allergic rhinitis, unspecified seasonality, unspecified trigger-Rx'd Allegra. Advised patient to take medication as directed with food to completion.  Advised patient to take prednisone and Allegra with first dose of Augmentin for the next 5 of 10 days.  Advised may use Allegra as needed afterwards for concurrent postnasal drip/drainage.  Encouraged patient to increase daily water intake while taking these medications.  Advised patient if symptoms worsen and/or unresolved please follow-up with PCP or here for further evaluation.  Patient discharged home, hemodynamically stable. ?Final Clinical Impressions(s) / UC Diagnoses  ? ?Final diagnoses:  ?Acute maxillary sinusitis, recurrence not specified  ?Sinus pressure  ?Allergic rhinitis, unspecified seasonality, unspecified trigger  ? ? ? ?Discharge Instructions   ? ?  ?Advised patient to take medication as  directed with food to completion.  Advised patient to take prednisone and Allegra with first dose of Augmentin for the next 5 of 10 days.  Advised may use Allegra as needed afterwards for concurrent postnasal dr

## 2021-12-19 ENCOUNTER — Telehealth: Payer: BC Managed Care – PPO | Admitting: Physician Assistant

## 2021-12-19 DIAGNOSIS — R3989 Other symptoms and signs involving the genitourinary system: Secondary | ICD-10-CM | POA: Diagnosis not present

## 2021-12-19 MED ORDER — SULFAMETHOXAZOLE-TRIMETHOPRIM 800-160 MG PO TABS: 1.0000 | ORAL_TABLET | Freq: Two times a day (BID) | ORAL | 0 refills | Status: DC

## 2021-12-19 NOTE — Progress Notes (Signed)

## 2022-12-10 ENCOUNTER — Telehealth: Payer: BC Managed Care – PPO | Admitting: Physician Assistant

## 2022-12-10 DIAGNOSIS — R3989 Other symptoms and signs involving the genitourinary system: Secondary | ICD-10-CM

## 2022-12-10 MED ORDER — SULFAMETHOXAZOLE-TRIMETHOPRIM 800-160 MG PO TABS: 1.0000 | ORAL_TABLET | Freq: Two times a day (BID) | ORAL | 0 refills | Status: AC

## 2022-12-10 NOTE — Progress Notes (Signed)
E-Visit for Urinary Problems  We are sorry that you are not feeling well.  Here is how we plan to help!  Based on what you shared with me it looks like you most likely have a simple urinary tract infection.  A UTI (Urinary Tract Infection) is a bacterial infection of the bladder.  Most cases of urinary tract infections are simple to treat but a key part of your care is to encourage you to drink plenty of fluids and watch your symptoms carefully.  I have prescribed Bactrim DS One tablet twice a day for 5 days.  Your symptoms should gradually improve. Call us if the burning in your urine worsens, you develop worsening fever, back pain or pelvic pain or if your symptoms do not resolve after completing the antibiotic.  Urinary tract infections can be prevented by drinking plenty of water to keep your body hydrated.  Also be sure when you wipe, wipe from front to back and don't hold it in!  If possible, empty your bladder every 4 hours.  HOME CARE Drink plenty of fluids Compete the full course of the antibiotics even if the symptoms resolve Remember, when you need to go.go. Holding in your urine can increase the likelihood of getting a UTI! GET HELP RIGHT AWAY IF: You cannot urinate You get a high fever Worsening back pain occurs You see blood in your urine You feel sick to your stomach or throw up You feel like you are going to pass out  MAKE SURE YOU  Understand these instructions. Will watch your condition. Will get help right away if you are not doing well or get worse.   Thank you for choosing an e-visit.  Your e-visit answers were reviewed by a board certified advanced clinical practitioner to complete your personal care plan. Depending upon the condition, your plan could have included both over the counter or prescription medications.  Please review your pharmacy choice. Make sure the pharmacy is open so you can pick up prescription now. If there is a problem, you may contact  your provider through MyChart messaging and have the prescription routed to another pharmacy.  Your safety is important to us. If you have drug allergies check your prescription carefully.   For the next 24 hours you can use MyChart to ask questions about today's visit, request a non-urgent call back, or ask for a work or school excuse. You will get an email in the next two days asking about your experience. I hope that your e-visit has been valuable and will speed your recovery.  I have spent 5 minutes in review of e-visit questionnaire, review and updating patient chart, medical decision making and response to patient.   Edom Schmuhl M Roselyn Doby, PA-C  

## 2023-03-05 DIAGNOSIS — E559 Vitamin D deficiency, unspecified: Secondary | ICD-10-CM | POA: Diagnosis not present

## 2023-03-05 DIAGNOSIS — J45990 Exercise induced bronchospasm: Secondary | ICD-10-CM | POA: Diagnosis not present

## 2023-03-05 DIAGNOSIS — Z1231 Encounter for screening mammogram for malignant neoplasm of breast: Secondary | ICD-10-CM | POA: Diagnosis not present

## 2023-03-05 DIAGNOSIS — Z130 Encounter for screening for diseases of the blood and blood-forming organs and certain disorders involving the immune mechanism: Secondary | ICD-10-CM | POA: Diagnosis not present

## 2023-03-05 DIAGNOSIS — Z1330 Encounter for screening examination for mental health and behavioral disorders, unspecified: Secondary | ICD-10-CM | POA: Diagnosis not present

## 2023-03-05 DIAGNOSIS — E7849 Other hyperlipidemia: Secondary | ICD-10-CM | POA: Diagnosis not present

## 2023-03-05 DIAGNOSIS — Z01419 Encounter for gynecological examination (general) (routine) without abnormal findings: Secondary | ICD-10-CM | POA: Diagnosis not present

## 2023-03-05 DIAGNOSIS — Z1329 Encounter for screening for other suspected endocrine disorder: Secondary | ICD-10-CM | POA: Diagnosis not present

## 2023-03-05 DIAGNOSIS — Z13228 Encounter for screening for other metabolic disorders: Secondary | ICD-10-CM | POA: Diagnosis not present

## 2023-03-05 DIAGNOSIS — Z1211 Encounter for screening for malignant neoplasm of colon: Secondary | ICD-10-CM | POA: Diagnosis not present

## 2023-03-05 DIAGNOSIS — Z124 Encounter for screening for malignant neoplasm of cervix: Secondary | ICD-10-CM | POA: Diagnosis not present

## 2023-03-05 DIAGNOSIS — Z131 Encounter for screening for diabetes mellitus: Secondary | ICD-10-CM | POA: Diagnosis not present

## 2023-03-05 DIAGNOSIS — Z1321 Encounter for screening for nutritional disorder: Secondary | ICD-10-CM | POA: Diagnosis not present

## 2023-03-10 DIAGNOSIS — M5384 Other specified dorsopathies, thoracic region: Secondary | ICD-10-CM | POA: Diagnosis not present

## 2023-03-10 DIAGNOSIS — M9902 Segmental and somatic dysfunction of thoracic region: Secondary | ICD-10-CM | POA: Diagnosis not present

## 2023-03-10 DIAGNOSIS — M9901 Segmental and somatic dysfunction of cervical region: Secondary | ICD-10-CM | POA: Diagnosis not present

## 2023-03-10 DIAGNOSIS — M50322 Other cervical disc degeneration at C5-C6 level: Secondary | ICD-10-CM | POA: Diagnosis not present

## 2023-03-11 DIAGNOSIS — M5384 Other specified dorsopathies, thoracic region: Secondary | ICD-10-CM | POA: Diagnosis not present

## 2023-03-11 DIAGNOSIS — M50322 Other cervical disc degeneration at C5-C6 level: Secondary | ICD-10-CM | POA: Diagnosis not present

## 2023-03-11 DIAGNOSIS — M9901 Segmental and somatic dysfunction of cervical region: Secondary | ICD-10-CM | POA: Diagnosis not present

## 2023-03-11 DIAGNOSIS — M9902 Segmental and somatic dysfunction of thoracic region: Secondary | ICD-10-CM | POA: Diagnosis not present

## 2023-03-13 DIAGNOSIS — M50322 Other cervical disc degeneration at C5-C6 level: Secondary | ICD-10-CM | POA: Diagnosis not present

## 2023-03-13 DIAGNOSIS — M9901 Segmental and somatic dysfunction of cervical region: Secondary | ICD-10-CM | POA: Diagnosis not present

## 2023-03-13 DIAGNOSIS — M5384 Other specified dorsopathies, thoracic region: Secondary | ICD-10-CM | POA: Diagnosis not present

## 2023-03-13 DIAGNOSIS — M9902 Segmental and somatic dysfunction of thoracic region: Secondary | ICD-10-CM | POA: Diagnosis not present

## 2023-03-17 DIAGNOSIS — M9902 Segmental and somatic dysfunction of thoracic region: Secondary | ICD-10-CM | POA: Diagnosis not present

## 2023-03-17 DIAGNOSIS — M5384 Other specified dorsopathies, thoracic region: Secondary | ICD-10-CM | POA: Diagnosis not present

## 2023-03-17 DIAGNOSIS — M50322 Other cervical disc degeneration at C5-C6 level: Secondary | ICD-10-CM | POA: Diagnosis not present

## 2023-03-17 DIAGNOSIS — M9901 Segmental and somatic dysfunction of cervical region: Secondary | ICD-10-CM | POA: Diagnosis not present

## 2023-03-18 DIAGNOSIS — M5384 Other specified dorsopathies, thoracic region: Secondary | ICD-10-CM | POA: Diagnosis not present

## 2023-03-18 DIAGNOSIS — M50322 Other cervical disc degeneration at C5-C6 level: Secondary | ICD-10-CM | POA: Diagnosis not present

## 2023-03-18 DIAGNOSIS — M9901 Segmental and somatic dysfunction of cervical region: Secondary | ICD-10-CM | POA: Diagnosis not present

## 2023-03-18 DIAGNOSIS — M9902 Segmental and somatic dysfunction of thoracic region: Secondary | ICD-10-CM | POA: Diagnosis not present

## 2023-03-20 DIAGNOSIS — Z1211 Encounter for screening for malignant neoplasm of colon: Secondary | ICD-10-CM | POA: Diagnosis not present

## 2023-03-20 DIAGNOSIS — M9902 Segmental and somatic dysfunction of thoracic region: Secondary | ICD-10-CM | POA: Diagnosis not present

## 2023-03-20 DIAGNOSIS — M5384 Other specified dorsopathies, thoracic region: Secondary | ICD-10-CM | POA: Diagnosis not present

## 2023-03-20 DIAGNOSIS — M9901 Segmental and somatic dysfunction of cervical region: Secondary | ICD-10-CM | POA: Diagnosis not present

## 2023-03-20 DIAGNOSIS — M50322 Other cervical disc degeneration at C5-C6 level: Secondary | ICD-10-CM | POA: Diagnosis not present

## 2023-03-24 DIAGNOSIS — M9902 Segmental and somatic dysfunction of thoracic region: Secondary | ICD-10-CM | POA: Diagnosis not present

## 2023-03-24 DIAGNOSIS — M50322 Other cervical disc degeneration at C5-C6 level: Secondary | ICD-10-CM | POA: Diagnosis not present

## 2023-03-24 DIAGNOSIS — M5384 Other specified dorsopathies, thoracic region: Secondary | ICD-10-CM | POA: Diagnosis not present

## 2023-03-24 DIAGNOSIS — M9901 Segmental and somatic dysfunction of cervical region: Secondary | ICD-10-CM | POA: Diagnosis not present

## 2023-03-25 DIAGNOSIS — M9901 Segmental and somatic dysfunction of cervical region: Secondary | ICD-10-CM | POA: Diagnosis not present

## 2023-03-25 DIAGNOSIS — M50322 Other cervical disc degeneration at C5-C6 level: Secondary | ICD-10-CM | POA: Diagnosis not present

## 2023-03-25 DIAGNOSIS — M9902 Segmental and somatic dysfunction of thoracic region: Secondary | ICD-10-CM | POA: Diagnosis not present

## 2023-03-25 DIAGNOSIS — M5384 Other specified dorsopathies, thoracic region: Secondary | ICD-10-CM | POA: Diagnosis not present

## 2023-03-27 DIAGNOSIS — M5384 Other specified dorsopathies, thoracic region: Secondary | ICD-10-CM | POA: Diagnosis not present

## 2023-03-27 DIAGNOSIS — M50322 Other cervical disc degeneration at C5-C6 level: Secondary | ICD-10-CM | POA: Diagnosis not present

## 2023-03-27 DIAGNOSIS — M9901 Segmental and somatic dysfunction of cervical region: Secondary | ICD-10-CM | POA: Diagnosis not present

## 2023-03-27 DIAGNOSIS — M9902 Segmental and somatic dysfunction of thoracic region: Secondary | ICD-10-CM | POA: Diagnosis not present

## 2023-03-31 DIAGNOSIS — M9902 Segmental and somatic dysfunction of thoracic region: Secondary | ICD-10-CM | POA: Diagnosis not present

## 2023-03-31 DIAGNOSIS — M5384 Other specified dorsopathies, thoracic region: Secondary | ICD-10-CM | POA: Diagnosis not present

## 2023-03-31 DIAGNOSIS — M50322 Other cervical disc degeneration at C5-C6 level: Secondary | ICD-10-CM | POA: Diagnosis not present

## 2023-03-31 DIAGNOSIS — M9901 Segmental and somatic dysfunction of cervical region: Secondary | ICD-10-CM | POA: Diagnosis not present

## 2023-04-01 DIAGNOSIS — M50322 Other cervical disc degeneration at C5-C6 level: Secondary | ICD-10-CM | POA: Diagnosis not present

## 2023-04-01 DIAGNOSIS — M5384 Other specified dorsopathies, thoracic region: Secondary | ICD-10-CM | POA: Diagnosis not present

## 2023-04-01 DIAGNOSIS — M9902 Segmental and somatic dysfunction of thoracic region: Secondary | ICD-10-CM | POA: Diagnosis not present

## 2023-04-01 DIAGNOSIS — M9901 Segmental and somatic dysfunction of cervical region: Secondary | ICD-10-CM | POA: Diagnosis not present

## 2023-04-03 DIAGNOSIS — M9902 Segmental and somatic dysfunction of thoracic region: Secondary | ICD-10-CM | POA: Diagnosis not present

## 2023-04-03 DIAGNOSIS — M5384 Other specified dorsopathies, thoracic region: Secondary | ICD-10-CM | POA: Diagnosis not present

## 2023-04-03 DIAGNOSIS — M9901 Segmental and somatic dysfunction of cervical region: Secondary | ICD-10-CM | POA: Diagnosis not present

## 2023-04-03 DIAGNOSIS — M50322 Other cervical disc degeneration at C5-C6 level: Secondary | ICD-10-CM | POA: Diagnosis not present

## 2023-04-07 DIAGNOSIS — M9901 Segmental and somatic dysfunction of cervical region: Secondary | ICD-10-CM | POA: Diagnosis not present

## 2023-04-07 DIAGNOSIS — M50322 Other cervical disc degeneration at C5-C6 level: Secondary | ICD-10-CM | POA: Diagnosis not present

## 2023-04-07 DIAGNOSIS — M5384 Other specified dorsopathies, thoracic region: Secondary | ICD-10-CM | POA: Diagnosis not present

## 2023-04-07 DIAGNOSIS — M9902 Segmental and somatic dysfunction of thoracic region: Secondary | ICD-10-CM | POA: Diagnosis not present

## 2023-04-10 DIAGNOSIS — M9901 Segmental and somatic dysfunction of cervical region: Secondary | ICD-10-CM | POA: Diagnosis not present

## 2023-04-10 DIAGNOSIS — M50322 Other cervical disc degeneration at C5-C6 level: Secondary | ICD-10-CM | POA: Diagnosis not present

## 2023-04-10 DIAGNOSIS — M5384 Other specified dorsopathies, thoracic region: Secondary | ICD-10-CM | POA: Diagnosis not present

## 2023-04-10 DIAGNOSIS — M9902 Segmental and somatic dysfunction of thoracic region: Secondary | ICD-10-CM | POA: Diagnosis not present

## 2023-04-21 DIAGNOSIS — M9902 Segmental and somatic dysfunction of thoracic region: Secondary | ICD-10-CM | POA: Diagnosis not present

## 2023-04-21 DIAGNOSIS — M5384 Other specified dorsopathies, thoracic region: Secondary | ICD-10-CM | POA: Diagnosis not present

## 2023-04-21 DIAGNOSIS — M50322 Other cervical disc degeneration at C5-C6 level: Secondary | ICD-10-CM | POA: Diagnosis not present

## 2023-04-21 DIAGNOSIS — M9901 Segmental and somatic dysfunction of cervical region: Secondary | ICD-10-CM | POA: Diagnosis not present

## 2023-04-24 DIAGNOSIS — M5384 Other specified dorsopathies, thoracic region: Secondary | ICD-10-CM | POA: Diagnosis not present

## 2023-04-24 DIAGNOSIS — M9902 Segmental and somatic dysfunction of thoracic region: Secondary | ICD-10-CM | POA: Diagnosis not present

## 2023-04-24 DIAGNOSIS — M50322 Other cervical disc degeneration at C5-C6 level: Secondary | ICD-10-CM | POA: Diagnosis not present

## 2023-04-24 DIAGNOSIS — M9901 Segmental and somatic dysfunction of cervical region: Secondary | ICD-10-CM | POA: Diagnosis not present

## 2023-05-05 DIAGNOSIS — M9902 Segmental and somatic dysfunction of thoracic region: Secondary | ICD-10-CM | POA: Diagnosis not present

## 2023-05-05 DIAGNOSIS — M50322 Other cervical disc degeneration at C5-C6 level: Secondary | ICD-10-CM | POA: Diagnosis not present

## 2023-05-05 DIAGNOSIS — M9901 Segmental and somatic dysfunction of cervical region: Secondary | ICD-10-CM | POA: Diagnosis not present

## 2023-05-05 DIAGNOSIS — M5384 Other specified dorsopathies, thoracic region: Secondary | ICD-10-CM | POA: Diagnosis not present

## 2023-05-15 DIAGNOSIS — M5384 Other specified dorsopathies, thoracic region: Secondary | ICD-10-CM | POA: Diagnosis not present

## 2023-05-15 DIAGNOSIS — M9901 Segmental and somatic dysfunction of cervical region: Secondary | ICD-10-CM | POA: Diagnosis not present

## 2023-05-15 DIAGNOSIS — M9902 Segmental and somatic dysfunction of thoracic region: Secondary | ICD-10-CM | POA: Diagnosis not present

## 2023-05-15 DIAGNOSIS — M50322 Other cervical disc degeneration at C5-C6 level: Secondary | ICD-10-CM | POA: Diagnosis not present

## 2023-05-28 DIAGNOSIS — R92333 Mammographic heterogeneous density, bilateral breasts: Secondary | ICD-10-CM | POA: Diagnosis not present

## 2023-05-28 DIAGNOSIS — Z1231 Encounter for screening mammogram for malignant neoplasm of breast: Secondary | ICD-10-CM | POA: Diagnosis not present
